# Patient Record
Sex: Female | Born: 1980 | Race: Black or African American | Hispanic: No | Marital: Married | Smoking: Never smoker
Health system: Southern US, Community
[De-identification: ages and names within clinical notes are randomized; demographics above are authoritative.]

## PROBLEM LIST (undated history)

## (undated) DIAGNOSIS — G8929 Other chronic pain: Secondary | ICD-10-CM

## (undated) DIAGNOSIS — Z809 Family history of malignant neoplasm, unspecified: Secondary | ICD-10-CM

## (undated) DIAGNOSIS — C50919 Malignant neoplasm of unspecified site of unspecified female breast: Secondary | ICD-10-CM

## (undated) DIAGNOSIS — R51 Headache: Secondary | ICD-10-CM

## (undated) DIAGNOSIS — J189 Pneumonia, unspecified organism: Secondary | ICD-10-CM

## (undated) DIAGNOSIS — R519 Headache, unspecified: Secondary | ICD-10-CM

## (undated) DIAGNOSIS — B009 Herpesviral infection, unspecified: Secondary | ICD-10-CM

## (undated) HISTORY — DX: Other chronic pain: G89.29

## (undated) HISTORY — DX: Family history of malignant neoplasm, unspecified: Z80.9

## (undated) HISTORY — PX: TUBAL LIGATION: SHX77

## (undated) HISTORY — PX: DILATION AND CURETTAGE OF UTERUS: SHX78

## (undated) HISTORY — DX: Headache: R51

## (undated) HISTORY — DX: Headache, unspecified: R51.9

---

## 2000-02-05 ENCOUNTER — Emergency Department (HOSPITAL_COMMUNITY): Admission: EM | Admit: 2000-02-05 | Discharge: 2000-02-05 | Payer: Self-pay | Admitting: Emergency Medicine

## 2001-10-10 ENCOUNTER — Inpatient Hospital Stay (HOSPITAL_COMMUNITY): Admission: AD | Admit: 2001-10-10 | Discharge: 2001-10-10 | Payer: Self-pay | Admitting: Obstetrics

## 2001-11-12 ENCOUNTER — Inpatient Hospital Stay (HOSPITAL_COMMUNITY): Admission: AD | Admit: 2001-11-12 | Discharge: 2001-11-12 | Payer: Self-pay | Admitting: Obstetrics

## 2001-11-23 ENCOUNTER — Encounter (HOSPITAL_COMMUNITY): Admission: RE | Admit: 2001-11-23 | Discharge: 2001-12-21 | Payer: Self-pay | Admitting: Obstetrics

## 2001-12-18 ENCOUNTER — Inpatient Hospital Stay (HOSPITAL_COMMUNITY): Admission: AD | Admit: 2001-12-18 | Discharge: 2001-12-18 | Payer: Self-pay | Admitting: Obstetrics

## 2001-12-19 ENCOUNTER — Inpatient Hospital Stay (HOSPITAL_COMMUNITY): Admission: AD | Admit: 2001-12-19 | Discharge: 2001-12-19 | Payer: Self-pay | Admitting: Obstetrics

## 2001-12-20 ENCOUNTER — Inpatient Hospital Stay (HOSPITAL_COMMUNITY): Admission: AD | Admit: 2001-12-20 | Discharge: 2001-12-21 | Payer: Self-pay | Admitting: Obstetrics

## 2002-06-24 ENCOUNTER — Encounter: Payer: Self-pay | Admitting: Emergency Medicine

## 2002-06-24 ENCOUNTER — Emergency Department (HOSPITAL_COMMUNITY): Admission: EM | Admit: 2002-06-24 | Discharge: 2002-06-24 | Payer: Self-pay | Admitting: Emergency Medicine

## 2002-08-27 ENCOUNTER — Encounter (INDEPENDENT_AMBULATORY_CARE_PROVIDER_SITE_OTHER): Payer: Self-pay | Admitting: *Deleted

## 2002-08-27 ENCOUNTER — Ambulatory Visit (HOSPITAL_COMMUNITY): Admission: RE | Admit: 2002-08-27 | Discharge: 2002-08-27 | Payer: Self-pay | Admitting: Obstetrics and Gynecology

## 2002-10-03 ENCOUNTER — Emergency Department (HOSPITAL_COMMUNITY): Admission: EM | Admit: 2002-10-03 | Discharge: 2002-10-03 | Payer: Self-pay | Admitting: Emergency Medicine

## 2003-01-08 ENCOUNTER — Emergency Department (HOSPITAL_COMMUNITY): Admission: EM | Admit: 2003-01-08 | Discharge: 2003-01-09 | Payer: Self-pay | Admitting: Emergency Medicine

## 2003-05-14 ENCOUNTER — Emergency Department (HOSPITAL_COMMUNITY): Admission: EM | Admit: 2003-05-14 | Discharge: 2003-05-14 | Payer: Self-pay | Admitting: Emergency Medicine

## 2003-06-10 ENCOUNTER — Inpatient Hospital Stay (HOSPITAL_COMMUNITY): Admission: AD | Admit: 2003-06-10 | Discharge: 2003-06-10 | Payer: Self-pay | Admitting: Obstetrics & Gynecology

## 2003-06-26 ENCOUNTER — Inpatient Hospital Stay (HOSPITAL_COMMUNITY): Admission: AD | Admit: 2003-06-26 | Discharge: 2003-06-26 | Payer: Self-pay | Admitting: Family Medicine

## 2003-07-06 ENCOUNTER — Inpatient Hospital Stay (HOSPITAL_COMMUNITY): Admission: AD | Admit: 2003-07-06 | Discharge: 2003-07-06 | Payer: Self-pay | Admitting: Obstetrics & Gynecology

## 2003-07-24 ENCOUNTER — Encounter: Payer: Self-pay | Admitting: Obstetrics

## 2003-07-24 ENCOUNTER — Inpatient Hospital Stay (HOSPITAL_COMMUNITY): Admission: AD | Admit: 2003-07-24 | Discharge: 2003-07-24 | Payer: Self-pay | Admitting: Obstetrics

## 2003-09-02 ENCOUNTER — Encounter: Payer: Self-pay | Admitting: Obstetrics & Gynecology

## 2003-09-02 ENCOUNTER — Ambulatory Visit (HOSPITAL_COMMUNITY): Admission: RE | Admit: 2003-09-02 | Discharge: 2003-09-02 | Payer: Self-pay | Admitting: Obstetrics & Gynecology

## 2003-10-16 ENCOUNTER — Inpatient Hospital Stay (HOSPITAL_COMMUNITY): Admission: AD | Admit: 2003-10-16 | Discharge: 2003-10-16 | Payer: Self-pay | Admitting: Obstetrics & Gynecology

## 2004-01-04 ENCOUNTER — Observation Stay (HOSPITAL_COMMUNITY): Admission: AD | Admit: 2004-01-04 | Discharge: 2004-01-05 | Payer: Self-pay | Admitting: Obstetrics & Gynecology

## 2004-01-18 ENCOUNTER — Inpatient Hospital Stay (HOSPITAL_COMMUNITY): Admission: AD | Admit: 2004-01-18 | Discharge: 2004-01-18 | Payer: Self-pay | Admitting: Obstetrics & Gynecology

## 2004-01-31 ENCOUNTER — Inpatient Hospital Stay (HOSPITAL_COMMUNITY): Admission: AD | Admit: 2004-01-31 | Discharge: 2004-02-02 | Payer: Self-pay | Admitting: Obstetrics & Gynecology

## 2005-01-23 ENCOUNTER — Emergency Department (HOSPITAL_COMMUNITY): Admission: EM | Admit: 2005-01-23 | Discharge: 2005-01-23 | Payer: Self-pay | Admitting: Family Medicine

## 2005-05-21 ENCOUNTER — Emergency Department (HOSPITAL_COMMUNITY): Admission: EM | Admit: 2005-05-21 | Discharge: 2005-05-21 | Payer: Self-pay | Admitting: Emergency Medicine

## 2005-05-24 ENCOUNTER — Inpatient Hospital Stay (HOSPITAL_COMMUNITY): Admission: AD | Admit: 2005-05-24 | Discharge: 2005-05-24 | Payer: Self-pay | Admitting: *Deleted

## 2005-06-18 ENCOUNTER — Encounter (INDEPENDENT_AMBULATORY_CARE_PROVIDER_SITE_OTHER): Payer: Self-pay | Admitting: *Deleted

## 2005-06-18 LAB — CONVERTED CEMR LAB

## 2005-08-06 ENCOUNTER — Ambulatory Visit: Payer: Self-pay | Admitting: Sports Medicine

## 2005-08-20 ENCOUNTER — Ambulatory Visit: Payer: Self-pay | Admitting: Family Medicine

## 2005-08-27 ENCOUNTER — Ambulatory Visit: Payer: Self-pay | Admitting: Sports Medicine

## 2005-09-05 ENCOUNTER — Emergency Department (HOSPITAL_COMMUNITY): Admission: EM | Admit: 2005-09-05 | Discharge: 2005-09-05 | Payer: Self-pay | Admitting: Emergency Medicine

## 2006-02-25 ENCOUNTER — Other Ambulatory Visit: Admission: RE | Admit: 2006-02-25 | Discharge: 2006-02-25 | Payer: Self-pay | Admitting: Obstetrics and Gynecology

## 2006-05-26 ENCOUNTER — Ambulatory Visit (HOSPITAL_COMMUNITY): Admission: RE | Admit: 2006-05-26 | Discharge: 2006-05-26 | Payer: Self-pay | Admitting: Obstetrics and Gynecology

## 2006-08-23 ENCOUNTER — Inpatient Hospital Stay (HOSPITAL_COMMUNITY): Admission: AD | Admit: 2006-08-23 | Discharge: 2006-08-23 | Payer: Self-pay | Admitting: Obstetrics and Gynecology

## 2006-09-03 ENCOUNTER — Inpatient Hospital Stay (HOSPITAL_COMMUNITY): Admission: AD | Admit: 2006-09-03 | Discharge: 2006-09-03 | Payer: Self-pay | Admitting: Obstetrics and Gynecology

## 2006-09-10 ENCOUNTER — Ambulatory Visit: Payer: Self-pay | Admitting: Obstetrics & Gynecology

## 2006-09-17 ENCOUNTER — Ambulatory Visit: Payer: Self-pay | Admitting: Obstetrics & Gynecology

## 2006-09-22 ENCOUNTER — Ambulatory Visit: Payer: Self-pay | Admitting: *Deleted

## 2006-09-22 ENCOUNTER — Inpatient Hospital Stay (HOSPITAL_COMMUNITY): Admission: AD | Admit: 2006-09-22 | Discharge: 2006-09-23 | Payer: Self-pay | Admitting: Obstetrics & Gynecology

## 2006-09-23 ENCOUNTER — Ambulatory Visit (HOSPITAL_COMMUNITY): Admission: RE | Admit: 2006-09-23 | Discharge: 2006-09-23 | Payer: Self-pay | Admitting: Obstetrics and Gynecology

## 2006-09-24 ENCOUNTER — Ambulatory Visit: Payer: Self-pay | Admitting: Obstetrics & Gynecology

## 2006-09-29 ENCOUNTER — Inpatient Hospital Stay (HOSPITAL_COMMUNITY): Admission: AD | Admit: 2006-09-29 | Discharge: 2006-09-29 | Payer: Self-pay | Admitting: Obstetrics & Gynecology

## 2006-09-29 ENCOUNTER — Ambulatory Visit: Payer: Self-pay | Admitting: Obstetrics and Gynecology

## 2006-10-01 ENCOUNTER — Ambulatory Visit: Payer: Self-pay | Admitting: Obstetrics & Gynecology

## 2006-10-08 ENCOUNTER — Ambulatory Visit: Payer: Self-pay | Admitting: Family Medicine

## 2006-10-15 ENCOUNTER — Ambulatory Visit: Payer: Self-pay | Admitting: Obstetrics & Gynecology

## 2006-10-20 ENCOUNTER — Inpatient Hospital Stay (HOSPITAL_COMMUNITY): Admission: RE | Admit: 2006-10-20 | Discharge: 2006-10-22 | Payer: Self-pay | Admitting: Obstetrics & Gynecology

## 2006-10-20 ENCOUNTER — Ambulatory Visit: Payer: Self-pay | Admitting: Gynecology

## 2006-11-05 ENCOUNTER — Ambulatory Visit: Payer: Self-pay | Admitting: Obstetrics and Gynecology

## 2006-12-11 ENCOUNTER — Emergency Department (HOSPITAL_COMMUNITY): Admission: EM | Admit: 2006-12-11 | Discharge: 2006-12-11 | Payer: Self-pay | Admitting: Family Medicine

## 2007-01-15 DIAGNOSIS — N879 Dysplasia of cervix uteri, unspecified: Secondary | ICD-10-CM | POA: Insufficient documentation

## 2007-01-15 DIAGNOSIS — K5900 Constipation, unspecified: Secondary | ICD-10-CM | POA: Insufficient documentation

## 2007-01-16 ENCOUNTER — Encounter (INDEPENDENT_AMBULATORY_CARE_PROVIDER_SITE_OTHER): Payer: Self-pay | Admitting: *Deleted

## 2007-02-11 ENCOUNTER — Emergency Department (HOSPITAL_COMMUNITY): Admission: EM | Admit: 2007-02-11 | Discharge: 2007-02-11 | Payer: Self-pay | Admitting: Emergency Medicine

## 2007-10-27 ENCOUNTER — Emergency Department (HOSPITAL_COMMUNITY): Admission: EM | Admit: 2007-10-27 | Discharge: 2007-10-27 | Payer: Self-pay | Admitting: Emergency Medicine

## 2008-01-13 ENCOUNTER — Emergency Department (HOSPITAL_COMMUNITY): Admission: EM | Admit: 2008-01-13 | Discharge: 2008-01-14 | Payer: Self-pay | Admitting: Emergency Medicine

## 2008-09-01 ENCOUNTER — Emergency Department (HOSPITAL_COMMUNITY): Admission: EM | Admit: 2008-09-01 | Discharge: 2008-09-02 | Payer: Self-pay | Admitting: Emergency Medicine

## 2008-11-18 DIAGNOSIS — J189 Pneumonia, unspecified organism: Secondary | ICD-10-CM

## 2008-11-18 HISTORY — DX: Pneumonia, unspecified organism: J18.9

## 2009-01-03 ENCOUNTER — Ambulatory Visit (HOSPITAL_COMMUNITY): Admission: RE | Admit: 2009-01-03 | Discharge: 2009-01-03 | Payer: Self-pay | Admitting: Obstetrics

## 2009-01-24 ENCOUNTER — Emergency Department (HOSPITAL_BASED_OUTPATIENT_CLINIC_OR_DEPARTMENT_OTHER): Admission: EM | Admit: 2009-01-24 | Discharge: 2009-01-24 | Payer: Self-pay | Admitting: Emergency Medicine

## 2010-08-11 ENCOUNTER — Emergency Department (HOSPITAL_COMMUNITY): Admission: EM | Admit: 2010-08-11 | Discharge: 2010-08-11 | Payer: Self-pay | Admitting: Emergency Medicine

## 2010-08-20 ENCOUNTER — Emergency Department (HOSPITAL_COMMUNITY): Admission: EM | Admit: 2010-08-20 | Discharge: 2010-08-20 | Payer: Self-pay | Admitting: Emergency Medicine

## 2010-09-01 ENCOUNTER — Emergency Department (HOSPITAL_COMMUNITY): Admission: EM | Admit: 2010-09-01 | Discharge: 2010-09-01 | Payer: Self-pay | Admitting: Emergency Medicine

## 2010-12-05 ENCOUNTER — Emergency Department (HOSPITAL_COMMUNITY)
Admission: EM | Admit: 2010-12-05 | Discharge: 2010-12-05 | Payer: Self-pay | Source: Home / Self Care | Admitting: Emergency Medicine

## 2010-12-10 LAB — URINALYSIS, ROUTINE W REFLEX MICROSCOPIC
Nitrite: NEGATIVE
Protein, ur: NEGATIVE mg/dL
Specific Gravity, Urine: 1.012 (ref 1.005–1.030)
Urine Glucose, Fasting: NEGATIVE mg/dL
pH: 8 (ref 5.0–8.0)

## 2010-12-10 LAB — POCT I-STAT, CHEM 8
BUN: 8 mg/dL (ref 6–23)
Calcium, Ion: 1.13 mmol/L (ref 1.12–1.32)
Chloride: 109 mEq/L (ref 96–112)
HCT: 35 % — ABNORMAL LOW (ref 36.0–46.0)
Hemoglobin: 11.9 g/dL — ABNORMAL LOW (ref 12.0–15.0)
Potassium: 4 mEq/L (ref 3.5–5.1)

## 2010-12-10 LAB — URINE MICROSCOPIC-ADD ON

## 2010-12-10 LAB — RAPID URINE DRUG SCREEN, HOSP PERFORMED
Amphetamines: NOT DETECTED
Barbiturates: NOT DETECTED
Tetrahydrocannabinol: NOT DETECTED

## 2011-01-31 LAB — POCT I-STAT, CHEM 8
Chloride: 105 mEq/L (ref 96–112)
Creatinine, Ser: 0.9 mg/dL (ref 0.4–1.2)
Glucose, Bld: 106 mg/dL — ABNORMAL HIGH (ref 70–99)
HCT: 35 % — ABNORMAL LOW (ref 36.0–46.0)
Hemoglobin: 11.9 g/dL — ABNORMAL LOW (ref 12.0–15.0)
Potassium: 3.5 mEq/L (ref 3.5–5.1)
TCO2: 25 mmol/L (ref 0–100)

## 2011-01-31 LAB — URINALYSIS, ROUTINE W REFLEX MICROSCOPIC
Glucose, UA: NEGATIVE mg/dL
Ketones, ur: NEGATIVE mg/dL
Nitrite: NEGATIVE
Specific Gravity, Urine: 1.015 (ref 1.005–1.030)
Urobilinogen, UA: 1 mg/dL (ref 0.0–1.0)
pH: 5.5 (ref 5.0–8.0)
pH: 6.5 (ref 5.0–8.0)

## 2011-01-31 LAB — ETHANOL: Alcohol, Ethyl (B): <5 mg/dL (ref 0–10)

## 2011-02-28 LAB — PREGNANCY, URINE: Preg Test, Ur: NEGATIVE

## 2011-02-28 LAB — URINE CULTURE: Colony Count: >=100000

## 2011-02-28 LAB — URINALYSIS, ROUTINE W REFLEX MICROSCOPIC
Bilirubin Urine: NEGATIVE
Specific Gravity, Urine: 1.012 (ref 1.005–1.030)

## 2011-02-28 LAB — URINE MICROSCOPIC-ADD ON

## 2011-03-05 LAB — CBC
HCT: 35.3 % — ABNORMAL LOW (ref 36.0–46.0)
MCV: 87.4 fL (ref 78.0–100.0)
Platelets: 258 10*3/uL (ref 150–400)
RBC: 4.03 MIL/uL (ref 3.87–5.11)
WBC: 7.5 10*3/uL (ref 4.0–10.5)

## 2011-03-05 LAB — ABO/RH: ABO/RH(D): A POS

## 2011-04-02 NOTE — Op Note (Signed)
Patricia Jenkins, Patricia Jenkins                  ACCOUNT NO.:  [id number]   MEDICAL RECORD NO.:  [id number]          PATIENT TYPE:  AMB   LOCATION:  SDC                           [facility]ACILITY:  [facility]   PHYSICIAN:  Lendon Colonel, MD   DATE OF BIRTH:  December 23, 1980   DATE OF PROCEDURE:  01/03/2009  DATE OF DISCHARGE:                               OPERATIVE REPORT   PREOPERATIVE DIAGNOSIS:  Undesired fertility.   POSTOPERATIVE DIAGNOSIS:  Undesired fertility.   PROCEDURE:  Laparoscopic tubal ligation.   SURGEON:  Alphonsus Sias. Ernestina Penna, MD   ASSISTANTS:  None.   ANESTHESIA:  General.   FINDINGS:  Normal uterus, tubes, and ovaries.  Normal liver edge.  Spot  of endometriosis at the left tubo-uterine junction.  No other signs of  endometriosis.  No ovarian cysts.   SPECIMENS:  None.   DISPOSITION:  None.   ANTIBIOTICS:  None.   ESTIMATED BLOOD LOSS:  Minimal.   COMPLICATIONS:  Equipment failure.   The patient in PACU in good condition.   PROCEDURE:  After informed consent was signed and obtained from the  patient, the patient was taken to the operating room where general  anesthesia was administered without complication.  The patient was  prepped and draped in normal sterile fashion in dorsal supine lithotomy  position.  A straight cath was performed.  A bimanual examination was  done to assess the size and position of the uterus.  A sterile speculum  was inserted into the vagina.  A single-tooth tenaculum was used to  grasp at the 12 o'clock position.  Cervix was sounded to 8.5 cm.  A ZUMI  uterine manipulator was placed into the uterus without complication.  Intrauterine balloon was inflated.  Instruments were removed from the  vagina.  Attention was turned to the patient's abdomen.  5 mL of 0.5%  Marcaine was infused in the infraumbilical folds.  A 5-mm skin incision  was made with the scalpel.  Veress needle was inserted.  Intraperitoneal  placement was confirmed.  Pneumoperitoneum was  created and a 5-mm non-  bladed XL trocar was inserted into the peritoneal cavity.  Pneumoperitoneum was maintained.  Laparoscope was inserted to the  patient's abdomen with findings as above.  An 8-mm skin incision was  made 2 fingerbreadths above the pubic symphysis in the midline with the  scalpel after first infusing with 5 mL of 0.5% Marcaine.  The 8-mm  bladed trocar was inserted into the peritoneal cavity with  visualization.  The Falope ring applicator was placed through this  trocar and initial attempts at extending the Falope ring times two were  unsuccessful.  The instruments were removed and the Falope ring  applicator was inspected. It was found that the tines were not aligned  properly. A second applicator was openend and found to have proper  alignment. On attempt at pulling the tube into the Falope ring  applicator, there was inability to apply the Falope ring.  This resulted  in transection of the tube and a small amount of bleeding.  The faulty  piece of  equipment was assessed by the several the OR nurses as well as  myself and the ring applicator was reloaded.  At this time, it was  returned back into the abdomen.  A 2-cm knuckle tube was pulled up into  the Falope ring and with easy application of the Falope ring good  blanching of the tube was noted.  The Falope ring applicator was  removed, reinserted, and 3 attempts at applying the Falope ring on the  contralateral tube were unsuccessful with each time resulting in  transection of a portion of the tube and mesosalpinx.  A very small  amount of bleeding was noted.  At this point, the decision was made to  proceed with a Filshie clip.  The Filshie clip applier was loaded,  placed through the 8-mm suprapubic port and Filshie clip was placed on  both the proximal and distal segments of the transected tube.  Good  hemostasis was noted.  All pneumoperitoneum was removed from the  abdomen.  Several minutes were allowed to  pass.  Pneumoperitoneum was  reinstituted and visualization of both transected tubes were done.  No  bleeding was noted after several minutes with no pneumoperitoneum.  It  was felt the patient would be hemodynamically sound.  Procedure was then  terminated.  All instruments were removed from the pelvis.  Pneumoperitoneum was released.  The skin was closed with sutures and  Dermabond.  Repeat speculum exams after removal of the vaginal  instruments was done.  Hemostasis was of the cervix and the uterus.  The  patient was awoken of general anesthesia having tolerated the procedure  well.  Sponge, lap, and needle counts were correct x3.      Lendon Colonel, MD  Electronically Signed     KAF/MEDQ  D:  01/03/2009  T:  01/04/2009  Job:  [id number]

## 2011-04-05 NOTE — Op Note (Signed)
   NAME:  Patricia Jenkins, Patricia Jenkins                            ACCOUNT NO.:  [id number]   MEDICAL RECORD NO.:  [id number]                   PATIENT TYPE:  AMB   LOCATION:  SDC                                  [facility]   PHYSICIAN:  Lenoard Aden, M.D.             DATE OF BIRTH:  1981/07/28   DATE OF PROCEDURE:  DATE OF DISCHARGE:                                 OPERATIVE REPORT   PREOPERATIVE DIAGNOSES:  Missed AB at 10 weeks.   POSTOPERATIVE DIAGNOSES:  Missed AB at 10 weeks.   PROCEDURE:  Suction D&E.   SURGEON:  Lenoard Aden, M.D.   ANESTHESIA:  MAC and paracervical.   ESTIMATED BLOOD LOSS:  Less than 50 cc.   COMPLICATIONS:  None.   DRAINS:  None.   COUNTS:  Correct.   DISPOSITION:  Patient apparently in good condition.   PATHOLOGY:  Products of conception to pathology.   PROCEDURE IN DETAIL:  Being apprized of the risks of anesthesia, infection,  bleeding, uterine perforation, and possible need for repair, the patient was  brought to the operating room.  She was administered with IV sedation  without difficulty and prepped and draped in the usual sterile fashion.  She  was catheterized.  It was felt that bladder was empty.  Exam under  anesthesia revealed an 8-10 week anteflexed uterus and no adnexal masses.  After achieving adequate anesthesia, a dilute solution was used to place the  paracervical block using 20 cc of Xylocaine solution in a standard fashion.  The uterus was sounded to 10 cm and easily dilated up to a #31 Pratt  dilator.  A 10 mm curved suction curette was placed.  Suction reveals  products of conception which were aspirated without difficulty.  Blunt  curettage in a four quadrant method confirms emptiness in the cavity.  Repeat suction curettage confirms.  The estimated blood loss was less than  50 cc.  Instruments were removed from the vagina.  The patient was awakened  and transferred to recovery in good condition.                          Lenoard Aden, M.D.    RJT/MEDQ  D:  08/27/2002  T:  08/28/2002  Job:  [id number]

## 2011-04-05 NOTE — Group Therapy Note (Signed)
Patricia Jenkins, Patricia Jenkins NO.:  [id number]   MEDICAL RECORD NO.:  [id number]          PATIENT TYPE:  WOC   LOCATION:  [facility]                   FACILITY:  [facility]   PHYSICIAN:  Argentina Donovan, MD        DATE OF BIRTH:  [birth year]   DATE OF SERVICE:  11/05/2006                                  CLINIC NOTE   The patient is a 30 year old African American female gravida 4, para 3-0-  1-3, her oldest child being 53 years old, her youngest 2 weeks.  The  patient comes in crying uncontrollably, severe coping disorder  postpartum, lives alone without much support. Her mother works in  hospice but has her own problems. Received a letter today that she is  being let go from her job, Christmas is around the corner, and she has  three small children, newborn, the oldest being four years old.  We are  giving her some Xanax to help control her immediate anxiety. Was started  on Prozac. Told her it may take 2 weeks before she gets any result and  giving her a phone number to call behavioral health center, hoping she  will get some more psychological support and help.   IMPRESSION:  Postpartum coping disorder.           ______________________________  Argentina Donovan, MD     PR/MEDQ  D:  11/05/2006  T:  11/05/2006  Job:  [id number]

## 2011-04-19 ENCOUNTER — Emergency Department (HOSPITAL_COMMUNITY)
Admission: EM | Admit: 2011-04-19 | Discharge: 2011-04-19 | Disposition: A | Discharge status: Home / Self Care | Payer: Self-pay | Attending: Emergency Medicine | Admitting: Emergency Medicine

## 2011-04-19 DIAGNOSIS — N898 Other specified noninflammatory disorders of vagina: Secondary | ICD-10-CM | POA: Insufficient documentation

## 2011-04-19 DIAGNOSIS — A6000 Herpesviral infection of urogenital system, unspecified: Secondary | ICD-10-CM | POA: Insufficient documentation

## 2011-04-19 LAB — URINE MICROSCOPIC-ADD ON

## 2011-04-19 LAB — URINALYSIS, ROUTINE W REFLEX MICROSCOPIC
Nitrite: NEGATIVE
Specific Gravity, Urine: 1.013 (ref 1.005–1.030)
Urobilinogen, UA: 0.2 mg/dL (ref 0.0–1.0)

## 2011-04-19 LAB — POCT PREGNANCY, URINE: Preg Test, Ur: NEGATIVE

## 2011-04-20 LAB — GC/CHLAMYDIA PROBE AMP, GENITAL
Chlamydia, DNA Probe: NEGATIVE
GC Probe Amp, Genital: NEGATIVE

## 2011-04-21 LAB — URINE CULTURE: Culture  Setup Time: 201206020043

## 2011-07-02 ENCOUNTER — Emergency Department (HOSPITAL_COMMUNITY)
Admission: EM | Admit: 2011-07-02 | Discharge: 2011-07-02 | Disposition: A | Discharge status: Home / Self Care | Payer: Self-pay | Attending: Emergency Medicine | Admitting: Emergency Medicine

## 2011-07-02 DIAGNOSIS — F3289 Other specified depressive episodes: Secondary | ICD-10-CM | POA: Insufficient documentation

## 2011-07-02 DIAGNOSIS — F329 Major depressive disorder, single episode, unspecified: Secondary | ICD-10-CM | POA: Insufficient documentation

## 2011-07-02 DIAGNOSIS — F341 Dysthymic disorder: Secondary | ICD-10-CM | POA: Insufficient documentation

## 2011-07-02 LAB — URINALYSIS, ROUTINE W REFLEX MICROSCOPIC
Bilirubin Urine: NEGATIVE
Hgb urine dipstick: NEGATIVE
Nitrite: NEGATIVE
Specific Gravity, Urine: 1.01 (ref 1.005–1.030)
pH: 7.5 (ref 5.0–8.0)

## 2011-07-02 LAB — URINE MICROSCOPIC-ADD ON

## 2011-07-02 LAB — PREGNANCY, URINE: Preg Test, Ur: NEGATIVE

## 2011-08-09 LAB — URINALYSIS, ROUTINE W REFLEX MICROSCOPIC
Bilirubin Urine: NEGATIVE
Nitrite: NEGATIVE
Protein, ur: 30 — AB
Specific Gravity, Urine: 1.031 — ABNORMAL HIGH
Urobilinogen, UA: 1

## 2011-08-09 LAB — DIFFERENTIAL
Basophils Absolute: 0
Basophils Relative: 0
Eosinophils Relative: 0
Monocytes Absolute: 0.9
Monocytes Relative: 8

## 2011-08-09 LAB — PREGNANCY, URINE: Preg Test, Ur: NEGATIVE

## 2011-08-09 LAB — BASIC METABOLIC PANEL
CO2: 28
Calcium: 9
Chloride: 105
Glucose, Bld: 119 — ABNORMAL HIGH
Potassium: 3.6
Sodium: 139

## 2011-08-09 LAB — URINE MICROSCOPIC-ADD ON

## 2011-08-09 LAB — CBC
HCT: 34.5 — ABNORMAL LOW
Hemoglobin: 11.9 — ABNORMAL LOW
MCHC: 34.5
MCV: 81.7
RBC: 4.23
RDW: 15.4

## 2011-08-10 ENCOUNTER — Inpatient Hospital Stay (INDEPENDENT_AMBULATORY_CARE_PROVIDER_SITE_OTHER)
Admission: RE | Admit: 2011-08-10 | Discharge: 2011-08-10 | Disposition: A | Discharge status: Home / Self Care | Payer: Self-pay | Source: Ambulatory Visit | Attending: Emergency Medicine | Admitting: Emergency Medicine

## 2011-08-10 DIAGNOSIS — N943 Premenstrual tension syndrome: Secondary | ICD-10-CM

## 2011-08-20 LAB — POCT PREGNANCY, URINE: Preg Test, Ur: NEGATIVE

## 2011-08-20 LAB — URINALYSIS, ROUTINE W REFLEX MICROSCOPIC
Bilirubin Urine: NEGATIVE
Glucose, UA: NEGATIVE
Hgb urine dipstick: NEGATIVE
Ketones, ur: NEGATIVE
Nitrite: NEGATIVE
Protein, ur: NEGATIVE
Specific Gravity, Urine: 1.02
Urobilinogen, UA: 0.2
pH: 6

## 2011-08-26 LAB — PREGNANCY, URINE: Preg Test, Ur: NEGATIVE

## 2011-08-26 LAB — URINE MICROSCOPIC-ADD ON

## 2011-08-26 LAB — URINALYSIS, ROUTINE W REFLEX MICROSCOPIC
Bilirubin Urine: NEGATIVE
Nitrite: POSITIVE — AB
Protein, ur: 30 — AB
Specific Gravity, Urine: 1.021
Urobilinogen, UA: 1

## 2011-10-02 ENCOUNTER — Inpatient Hospital Stay (HOSPITAL_COMMUNITY)
Admission: EM | Admit: 2011-10-02 | Discharge: 2011-10-04 | DRG: 419 | Disposition: A | Discharge status: Home / Self Care | Payer: Medicaid Other | Attending: Surgery | Admitting: Surgery

## 2011-10-02 ENCOUNTER — Other Ambulatory Visit: Payer: Self-pay

## 2011-10-02 ENCOUNTER — Emergency Department (HOSPITAL_COMMUNITY): Payer: Medicaid Other

## 2011-10-02 ENCOUNTER — Encounter: Payer: Self-pay | Admitting: *Deleted

## 2011-10-02 DIAGNOSIS — K8010 Calculus of gallbladder with chronic cholecystitis without obstruction: Secondary | ICD-10-CM

## 2011-10-02 DIAGNOSIS — B009 Herpesviral infection, unspecified: Secondary | ICD-10-CM | POA: Diagnosis present

## 2011-10-02 DIAGNOSIS — K8020 Calculus of gallbladder without cholecystitis without obstruction: Secondary | ICD-10-CM | POA: Diagnosis present

## 2011-10-02 DIAGNOSIS — R748 Abnormal levels of other serum enzymes: Secondary | ICD-10-CM

## 2011-10-02 DIAGNOSIS — K8050 Calculus of bile duct without cholangitis or cholecystitis without obstruction: Secondary | ICD-10-CM | POA: Diagnosis present

## 2011-10-02 HISTORY — DX: Pneumonia, unspecified organism: J18.9

## 2011-10-02 HISTORY — DX: Herpesviral infection, unspecified: B00.9

## 2011-10-02 LAB — CBC
HCT: 34.8 % — ABNORMAL LOW (ref 36.0–46.0)
Hemoglobin: 11.8 g/dL — ABNORMAL LOW (ref 12.0–15.0)
MCH: 26.9 pg (ref 26.0–34.0)
MCHC: 33.9 g/dL (ref 30.0–36.0)

## 2011-10-02 LAB — DIFFERENTIAL
Basophils Relative: 0 % (ref 0–1)
Eosinophils Absolute: 0 10*3/uL (ref 0.0–0.7)
Eosinophils Relative: 0 % (ref 0–5)
Monocytes Absolute: 0.8 10*3/uL (ref 0.1–1.0)
Monocytes Relative: 6 % (ref 3–12)
Neutrophils Relative %: 89 % — ABNORMAL HIGH (ref 43–77)

## 2011-10-02 LAB — COMPREHENSIVE METABOLIC PANEL
ALT: 168 U/L — ABNORMAL HIGH (ref 0–35)
AST: 401 U/L — ABNORMAL HIGH (ref 0–37)
CO2: 24 mEq/L (ref 19–32)
Calcium: 10.1 mg/dL (ref 8.4–10.5)
GFR calc non Af Amer: >90 mL/min (ref >90)
Potassium: 4.5 mEq/L (ref 3.5–5.1)

## 2011-10-02 MED ORDER — PANTOPRAZOLE SODIUM 40 MG IV SOLR
40.0000 mg | Freq: Every day | INTRAVENOUS | Status: DC
  Administered 2011-10-02 – 2011-10-03 (×2): 40 mg via INTRAVENOUS
  Filled 2011-10-02 (×2): qty 40

## 2011-10-02 MED ORDER — MORPHINE SULFATE 2 MG/ML IJ SOLN
INTRAMUSCULAR | Status: AC
  Filled 2011-10-02: qty 1

## 2011-10-02 MED ORDER — FENTANYL CITRATE 0.05 MG/ML IJ SOLN
INTRAMUSCULAR | Status: AC
  Filled 2011-10-02: qty 2

## 2011-10-02 MED ORDER — HYDROMORPHONE HCL PF 2 MG/ML IJ SOLN
INTRAMUSCULAR | Status: AC
  Filled 2011-10-02: qty 1

## 2011-10-02 MED ORDER — HYDROMORPHONE HCL PF 1 MG/ML IJ SOLN: 1.0000 mg | Freq: Once | INTRAMUSCULAR | Status: DC

## 2011-10-02 MED ORDER — ONDANSETRON HCL 4 MG/2ML IJ SOLN
INTRAMUSCULAR | Status: AC
  Filled 2011-10-02: qty 2

## 2011-10-02 MED ORDER — MORPHINE SULFATE 4 MG/ML IJ SOLN
4.0000 mg | Freq: Once | INTRAMUSCULAR | Status: AC
  Administered 2011-10-02: 4 mg via INTRAVENOUS
  Filled 2011-10-02: qty 1

## 2011-10-02 MED ORDER — KCL IN DEXTROSE-NACL 20-5-0.45 MEQ/L-%-% IV SOLN
INTRAVENOUS | Status: DC
  Administered 2011-10-02 – 2011-10-03 (×3): via INTRAVENOUS
  Filled 2011-10-02 (×5): qty 1000

## 2011-10-02 MED ORDER — MORPHINE SULFATE 2 MG/ML IJ SOLN
1.0000 mg | INTRAMUSCULAR | Status: DC | PRN
  Administered 2011-10-02 – 2011-10-04 (×8): 2 mg via INTRAVENOUS
  Filled 2011-10-02 (×7): qty 1

## 2011-10-02 MED ORDER — ONDANSETRON HCL 4 MG/2ML IJ SOLN
4.0000 mg | Freq: Once | INTRAMUSCULAR | Status: AC
  Administered 2011-10-02: 4 mg via INTRAVENOUS

## 2011-10-02 MED ORDER — ONDANSETRON HCL 4 MG/2ML IJ SOLN
INTRAMUSCULAR | Status: AC
  Administered 2011-10-02: 4 mg
  Filled 2011-10-02: qty 2

## 2011-10-02 MED ORDER — ACETAMINOPHEN 325 MG PO TABS: 650.0000 mg | ORAL_TABLET | Freq: Four times a day (QID) | ORAL | Status: DC | PRN

## 2011-10-02 MED ORDER — ACETAMINOPHEN 650 MG RE SUPP: 650.0000 mg | Freq: Four times a day (QID) | RECTAL | Status: DC | PRN

## 2011-10-02 MED ORDER — PIPERACILLIN-TAZOBACTAM 3.375 G IVPB
3.3750 g | Freq: Three times a day (TID) | INTRAVENOUS | Status: DC
  Administered 2011-10-02 – 2011-10-03 (×4): 3.375 g via INTRAVENOUS
  Filled 2011-10-02 (×8): qty 50

## 2011-10-02 MED ORDER — HYDROMORPHONE HCL PF 1 MG/ML IJ SOLN
INTRAMUSCULAR | Status: AC
  Filled 2011-10-02: qty 1

## 2011-10-02 MED ORDER — INFLUENZA VIRUS VACC SPLIT PF IM SUSP
0.5000 mL | INTRAMUSCULAR | Status: AC
  Administered 2011-10-03: 0.5 mL via INTRAMUSCULAR
  Filled 2011-10-02: qty 0.5

## 2011-10-02 MED ORDER — SODIUM CHLORIDE 0.9 % IV BOLUS (SEPSIS)
500.0000 mL | Freq: Once | INTRAVENOUS | Status: AC
  Administered 2011-10-02: 500 mL via INTRAVENOUS

## 2011-10-02 MED ORDER — SODIUM CHLORIDE 0.9 % IV SOLN
999.0000 mL | INTRAVENOUS | Status: DC
  Administered 2011-10-02: 999 mL via INTRAVENOUS

## 2011-10-02 MED ORDER — ONDANSETRON HCL 4 MG/2ML IJ SOLN: 4.0000 mg | Freq: Four times a day (QID) | INTRAMUSCULAR | Status: DC | PRN

## 2011-10-02 NOTE — ED Notes (Signed)
Patient states she was awaken by epigastric pain onset 130 am , vomited states the pain is getting worse will not go away

## 2011-10-02 NOTE — Consult Note (Signed)
I have seen and examined the patient and agree with the assessment and plans  

## 2011-10-02 NOTE — ED Notes (Signed)
Pt reports sharp epigastric pain starting around 0100.

## 2011-10-02 NOTE — ED Provider Notes (Signed)
History     CSN: [id number] Arrival date & time: 10/02/2011  5:46 AM   First MD Initiated Contact with Patient 10/02/11 0622      Chief Complaint  Patient presents with  . Abdominal Pain    (Consider location/radiation/quality/duration/timing/severity/associated sxs/prior treatment) Patient is a 30 y.o. female presenting with abdominal pain. The history is provided by the patient.  Abdominal Pain The primary symptoms of the illness include abdominal pain, nausea and vomiting. The primary symptoms of the illness do not include fever, shortness of breath, diarrhea, hematemesis or dysuria. The current episode started 6 to 12 hours ago. The onset of the illness was sudden. The problem has not changed since onset. The patient states that she believes she is currently not pregnant. Additional symptoms associated with the illness include heartburn. Symptoms associated with the illness do not include chills, hematuria or back pain.   patient presents with multiple episodes of vomiting associated with upper abdominal pain that awoke her from sleep at 1 AM. The episodes lasted until 5 AM and resolved spontaneously. The patient reports that she ate a burger and french fries for dinner at 9 PM last night. He reports similar episodes in the past that seem to be related to the food she eats. She took Zantac and TUMS but reports there was no relief after these and they were vomited back up. She denies any fever or chills dizziness lightheadedness cramping or diarrhea.  Past Medical History  Diagnosis Date  . Herpes     History reviewed. No pertinent past surgical history.  No family history on file.  History  Substance Use Topics  . Smoking status: Never Smoker   . Smokeless tobacco: Not on file  . Alcohol Use: Yes     Review of Systems  Constitutional: Negative for fever and chills.  HENT: Negative for nosebleeds, neck pain and neck stiffness.   Eyes: Negative for pain and visual  disturbance.  Respiratory: Negative for cough, chest tightness and shortness of breath.   Cardiovascular: Negative for chest pain, palpitations and leg swelling.  Gastrointestinal: Positive for heartburn, nausea, vomiting and abdominal pain. Negative for diarrhea, blood in stool and hematemesis.       Negative for hematemesis  Genitourinary: Negative for dysuria, hematuria and flank pain.  Musculoskeletal: Negative for back pain and gait problem.  Skin: Negative for color change and rash.  Neurological: Negative for dizziness, seizures, syncope, weakness and light-headedness.  Hematological: Does not bruise/bleed easily.  Psychiatric/Behavioral: Negative for behavioral problems and confusion.    Allergies  Review of patient's allergies indicates no known allergies.  Home Medications  No current outpatient prescriptions on file.  BP 119/73  Pulse 90  Temp(Src) 97.2 F (36.2 C) (Oral)  Resp 18  SpO2 100%  Physical Exam  Constitutional: She is oriented to person, place, and time. She appears well-developed and well-nourished.       Uncomfortable appearing  HENT:  Head: Normocephalic and atraumatic.  Eyes: Conjunctivae are normal. Pupils are equal, round, and reactive to light.  Neck: Normal range of motion. Neck supple.  Cardiovascular: Normal rate and regular rhythm.   Pulmonary/Chest: Effort normal and breath sounds normal.  Abdominal: Soft. She exhibits no distension.       Moderate tenderness to the epigastrium and right upper quadrant  Musculoskeletal: She exhibits no edema and no tenderness.  Neurological: She is alert and oriented to person, place, and time. No cranial nerve deficit. Coordination normal.  Skin: Skin is warm and dry.  No rash noted.  Psychiatric: Her behavior is normal.    ED Course  Procedures (including critical care time)  Labs Reviewed  COMPREHENSIVE METABOLIC PANEL - Abnormal; Notable for the following:    Glucose, Bld 113 (*)    Total Protein  8.4 (*)    AST 401 (*)    ALT 168 (*)    All other components within normal limits  CBC - Abnormal; Notable for the following:    WBC 13.5 (*)    Hemoglobin 11.8 (*)    HCT 34.8 (*)    All other components within normal limits  DIFFERENTIAL - Abnormal; Notable for the following:    Neutrophils Relative 89 (*)    Neutro Abs 12.1 (*)    Lymphocytes Relative 5 (*)    Lymphs Abs 0.6 (*)    All other components within normal limits  LIPASE, BLOOD  PREGNANCY, URINE   US Abdomen Complete  10/02/2011  *RADIOLOGY REPORT*  Clinical Data:  Upper abdominal pain  COMPLETE ABDOMINAL ULTRASOUND  Comparison:  CT abdomen and pelvis of 01/14/2008  Findings:  Gallbladder:  There are small echo densities within the gallbladder.  One of these probably represents a polyp, but a tiny gallstone with some sludge cannot be excluded.  There is no pain over gallbladder with compression.  Common bile duct:  The common bile duct is within upper limits of normal measuring 5.9 mm.  Correlation with liver function tests is recommended.  Liver:  The liver has a normal echogenic pattern.  There may be slight prominence of the central intrahepatic ducts.  Again correlation with liver function tests is recommended.  IVC:  Appears normal.  Pancreas:  No focal abnormality seen.  Spleen:  The spleen is normal measuring 7.4 cm sagittally.  Right Kidney:  No hydronephrosis is seen.  The right kidney measures 12.7 cm sagittally.  A cyst is present in the midportion of 1.3 cm.  Left Kidney:  No hydronephrosis.  The left kidney measures 12.8 cm.  Abdominal aorta:  The abdominal aorta is normal in caliber .  IMPRESSION:  1.  Small gallbladder polyp and possible tiny gallstone.  No evidence of acute cholecystitis. 2.  Slightly prominent common bile duct with questionable minimal dilatation of central intrahepatic ducts.  Correlate with liver function tests.  Original Report Authenticated By: Juline Patch, M.D.     1. Cholelithiasis   2.  Elevated liver enzymes       MDM  Labs an ultrasound reviewed by myself. Patient has right upper quadrant and epigastric pain in the setting of elevated liver enzymes and a questionable dilated common bile duct. Will consult surgery and plan for admission with likely ERCP to evaluate for CBD stone.  10:52am: I spoken with Caryn Bee the PA with central  surgery. He will see the patient in the emergency department.  Shaaron Adler, [place] 10/02/11 [address] [place], [place] 10/02/11 1254

## 2011-10-02 NOTE — ED Provider Notes (Signed)
Date: 10/02/2011  Rate: 89  Rhythm: normal sinus rhythm  QRS Axis: normal  Intervals: normal  ST/T Wave abnormalities: nonspecific T wave changes  Conduction Disutrbances:none  Narrative Interpretation: t wave flattening laterally  Old EKG Reviewed: unchanged  Medical screening examination/treatment/procedure(s) were performed by non-physician practitioner and as supervising physician I was immediately available for consultation/collaboration.   Olivia Mackie, MD 10/02/11 [phone]

## 2011-10-02 NOTE — ED Notes (Signed)
Got report from [place], [place]; introduced myself; no needs at this time

## 2011-10-02 NOTE — ED Notes (Signed)
Pt resting..talking on the phone

## 2011-10-02 NOTE — Consult Note (Signed)
Patricia Jenkins July 23, 1981  [id number].   Requesting MD: Dr. Norlene Campbell Chief Complaint/Reason for Consult: Chest pain, upper abdominal pain with gallstones on ultrasound HPI:  This is a 30 year old black female who is otherwise healthy. She ate a [place] burger and fries last night. Around 1 AM this morning the patient awoke with severe chest pain that radiated under her right breast into her back. She states this was constant. She had multiple episodes of nausea and vomiting. This lasted for approximately 5 hours. She then presented to the emergency department. She had further evaluation which included an abdominal ultrasound. This revealed no gallbladder wall thickening however she did have possible tiny gallstones versus a polyp. She did have a common bile duct of 5.9 mm. She is on a white blood cell count of 13,000 neutrophil count of 89%. The patient is now asymptomatic. We have been asked to evaluate the patient for possible surgical admission.  Review of systems: Please see history of present illness otherwise all other systems have been reviewed and are negative.  History reviewed. No pertinent family history.  Past Medical History  Diagnosis Date  . Herpes     Past Surgical History  Procedure Date  . Dilation and curettage of uterus     Social History:  reports that she has never smoked. She does not have any smokeless tobacco history on file. She reports that she drinks alcohol. She reports that she does not use illicit drugs.  Allergies: No Known Allergies  Medications Prior to Admission  Medication Dose Route Frequency Provider Last Rate Last Dose  . 0.9 %  sodium chloride infusion  999 mL Intravenous Continuous Shaaron Adler, PA 100 mL/hr at 10/02/11 0630 999 mL at 10/02/11 0630  . morphine 4 MG/ML injection 4 mg  4 mg Intravenous Once Shaaron Adler, PA   4 mg at 10/02/11 0754  . morphine 4 MG/ML injection 4 mg  4 mg Intravenous Once Shaaron Adler, PA    4 mg at 10/02/11 1029  . ondansetron (ZOFRAN) injection 4 mg  4 mg Intravenous Once Shaaron Adler, PA   4 mg at 10/02/11 0754  . sodium chloride 0.9 % bolus 500 mL  500 mL Intravenous Once Shaaron Adler, PA   500 mL at 10/02/11 0630  . DISCONTD: fentaNYL (SUBLIMAZE) 0.05 MG/ML injection           . DISCONTD: HYDROmorphone (DILAUDID) 1 MG/ML injection           . DISCONTD: ondansetron (ZOFRAN) 4 MG/2ML injection            No current outpatient prescriptions on file as of 10/02/2011.    Blood pressure 119/73, pulse 90, temperature 97.2 F (36.2 C), temperature source Oral, resp. rate 18, SpO2 100.00%. BP 119/73  Pulse 90  Temp(Src) 97.2 F (36.2 C) (Oral)  Resp 18  SpO2 100%  General Appearance:    Alert, cooperative, no distress, appears stated age  Head:    Normocephalic, without obvious abnormality, atraumatic  Eyes:    PERRL, conjunctiva/corneas clear  Ears:    No obvious masses or lesions  Nose:   No obvious masses or lesion  Throat:   Lips, mucosa, and tongue normal        Lungs:     Clear to auscultation bilaterally, respirations unlabored      Heart:    Regular rate and rhythm, S1 and S2 normal, no murmur, rub   or gallop, palpable carotid, radial,  and pedal pulses bilaterally.     Abdomen:     Soft, non-tender, bowel sounds active all four quadrants,    no masses, no organomegaly, nondistended        Extremities:   Extremities normal, atraumatic, no cyanosis or edema     Skin:   Skin color, texture, turgor normal, no rashes or lesions     Psych:   Alert and oriented x3 with an appropriate affect      Results for orders placed during the hospital encounter of 10/02/11 (from the past 48 hour(s))  COMPREHENSIVE METABOLIC PANEL     Status: Abnormal   Collection Time   10/02/11  6:45 AM      Component Value Range Comment   Sodium 138  135 - 145 (mEq/L)    Potassium 4.5  3.5 - 5.1 (mEq/L)    Chloride 103  96 - 112 (mEq/L)    CO2 24  19 - 32  (mEq/L)    Glucose, Bld 113 (*) 70 - 99 (mg/dL)    BUN 9  6 - 23 (mg/dL)    Creatinine, Ser 9.60  0.50 - 1.10 (mg/dL)    Calcium 45.4  8.4 - 10.5 (mg/dL)    Total Protein 8.4 (*) 6.0 - 8.3 (g/dL)    Albumin 4.2  3.5 - 5.2 (g/dL)    AST 098 (*) 0 - 37 (U/L)    ALT 168 (*) 0 - 35 (U/L)    Alkaline Phosphatase 90  39 - 117 (U/L)    Total Bilirubin 0.4  0.3 - 1.2 (mg/dL)    GFR calc non Af Amer >90  >90 (mL/min)    GFR calc Af Amer >90  >90 (mL/min)   CBC     Status: Abnormal   Collection Time   10/02/11  6:45 AM      Component Value Range Comment   WBC 13.5 (*) 4.0 - 10.5 (K/uL)    RBC 4.39  3.87 - 5.11 (MIL/uL)    Hemoglobin 11.8 (*) 12.0 - 15.0 (g/dL)    HCT 11.9 (*) 14.7 - 46.0 (%)    MCV 79.3  78.0 - 100.0 (fL)    MCH 26.9  26.0 - 34.0 (pg)    MCHC 33.9  30.0 - 36.0 (g/dL)    RDW 82.9  56.2 - 13.0 (%)    Platelets 274  150 - 400 (K/uL)   DIFFERENTIAL     Status: Abnormal   Collection Time   10/02/11  6:45 AM      Component Value Range Comment   Neutrophils Relative 89 (*) 43 - 77 (%)    Neutro Abs 12.1 (*) 1.7 - 7.7 (K/uL)    Lymphocytes Relative 5 (*) 12 - 46 (%)    Lymphs Abs 0.6 (*) 0.7 - 4.0 (K/uL)    Monocytes Relative 6  3 - 12 (%)    Monocytes Absolute 0.8  0.1 - 1.0 (K/uL)    Eosinophils Relative 0  0 - 5 (%)    Eosinophils Absolute 0.0  0.0 - 0.7 (K/uL)    Basophils Relative 0  0 - 1 (%)    Basophils Absolute 0.0  0.0 - 0.1 (K/uL)   LIPASE, BLOOD     Status: Normal   Collection Time   10/02/11  6:45 AM      Component Value Range Comment   Lipase 25  11 - 59 (U/L)   PREGNANCY, URINE     Status: Normal   Collection Time  10/02/11  6:54 AM      Component Value Range Comment   Preg Test, Ur NEGATIVE      US Abdomen Complete  10/02/2011  *RADIOLOGY REPORT*  Clinical Data:  Upper abdominal pain  COMPLETE ABDOMINAL ULTRASOUND  Comparison:  CT abdomen and pelvis of 01/14/2008  Findings:  Gallbladder:  There are small echo densities within the gallbladder.  One  of these probably represents a polyp, but a tiny gallstone with some sludge cannot be excluded.  There is no pain over gallbladder with compression.  Common bile duct:  The common bile duct is within upper limits of normal measuring 5.9 mm.  Correlation with liver function tests is recommended.  Liver:  The liver has a normal echogenic pattern.  There may be slight prominence of the central intrahepatic ducts.  Again correlation with liver function tests is recommended.  IVC:  Appears normal.  Pancreas:  No focal abnormality seen.  Spleen:  The spleen is normal measuring 7.4 cm sagittally.  Right Kidney:  No hydronephrosis is seen.  The right kidney measures 12.7 cm sagittally.  A cyst is present in the midportion of 1.3 cm.  Left Kidney:  No hydronephrosis.  The left kidney measures 12.8 cm.  Abdominal aorta:  The abdominal aorta is normal in caliber .  IMPRESSION:  1.  Small gallbladder polyp and possible tiny gallstone.  No evidence of acute cholecystitis. 2.  Slightly prominent common bile duct with questionable minimal dilatation of central intrahepatic ducts.  Correlate with liver function tests.  Original Report Authenticated By: Juline Patch, M.D.       Assessment/Plan 1. Biliary colic, questionable cholelithiasis  Plan: I informed the patient that given the fact she has had several prior episodes, she is likely to have a recurrent episode of biliary colic. We did recommend surgical admission for possible laparoscopic cholecystectomy. The patient has never had surgery and is in disbelief. She is currently contacting multiple family members to decide what she would like to do. I have discussed this with Dr.Blackman and he feels that if the patient is pain-free and can tolerate a diet she may go home and come and see [place] as an outpatient for elective resection. I have also discussed this plan with the patient as well. Once she decides which option she would like to pursue we will then make further  recommendations.  Runette Scifres E 10/02/2011, 11:34 AM

## 2011-10-02 NOTE — ED Notes (Signed)
Transported to ultrasound

## 2011-10-03 ENCOUNTER — Encounter (HOSPITAL_COMMUNITY): Admission: EM | Disposition: A | Discharge status: Home / Self Care | Payer: Self-pay | Source: Home / Self Care

## 2011-10-03 ENCOUNTER — Inpatient Hospital Stay: Admit: 2011-10-03 | Payer: Self-pay | Admitting: Surgery

## 2011-10-03 ENCOUNTER — Other Ambulatory Visit (INDEPENDENT_AMBULATORY_CARE_PROVIDER_SITE_OTHER): Payer: Self-pay | Admitting: Surgery

## 2011-10-03 ENCOUNTER — Inpatient Hospital Stay (HOSPITAL_COMMUNITY): Payer: Medicaid Other | Admitting: Anesthesiology

## 2011-10-03 ENCOUNTER — Encounter (HOSPITAL_COMMUNITY): Payer: Self-pay | Admitting: *Deleted

## 2011-10-03 ENCOUNTER — Encounter (HOSPITAL_COMMUNITY): Payer: Self-pay | Admitting: Anesthesiology

## 2011-10-03 HISTORY — PX: CHOLECYSTECTOMY: SHX55

## 2011-10-03 LAB — COMPREHENSIVE METABOLIC PANEL
ALT: 82 U/L — ABNORMAL HIGH (ref 0–35)
Alkaline Phosphatase: 70 U/L (ref 39–117)
CO2: 23 mEq/L (ref 19–32)
Calcium: 8.4 mg/dL (ref 8.4–10.5)
Chloride: 104 mEq/L (ref 96–112)
GFR calc Af Amer: >90 mL/min (ref >90)
GFR calc non Af Amer: >90 mL/min (ref >90)
Glucose, Bld: 96 mg/dL (ref 70–99)
Sodium: 137 mEq/L (ref 135–145)
Total Bilirubin: 0.4 mg/dL (ref 0.3–1.2)

## 2011-10-03 LAB — CBC
Hemoglobin: 10.2 g/dL — ABNORMAL LOW (ref 12.0–15.0)
MCH: 26.4 pg (ref 26.0–34.0)
RBC: 3.87 MIL/uL (ref 3.87–5.11)
WBC: 6.2 10*3/uL (ref 4.0–10.5)

## 2011-10-03 LAB — SURGICAL PCR SCREEN: MRSA, PCR: NEGATIVE

## 2011-10-03 SURGERY — LAPAROSCOPIC CHOLECYSTECTOMY WITH INTRAOPERATIVE CHOLANGIOGRAM
Anesthesia: General | Site: Abdomen | Wound class: Clean

## 2011-10-03 MED ORDER — KETOROLAC TROMETHAMINE 30 MG/ML IJ SOLN
INTRAMUSCULAR | Status: DC | PRN
  Administered 2011-10-03: 30 mg via INTRAVENOUS

## 2011-10-03 MED ORDER — MIDAZOLAM HCL 5 MG/5ML IJ SOLN
INTRAMUSCULAR | Status: DC | PRN
  Administered 2011-10-03: 2 mg via INTRAVENOUS

## 2011-10-03 MED ORDER — SODIUM CHLORIDE 0.9 % IR SOLN
Status: DC | PRN
  Administered 2011-10-03 (×2): 1000 mL

## 2011-10-03 MED ORDER — KCL IN DEXTROSE-NACL 20-5-0.9 MEQ/L-%-% IV SOLN
INTRAVENOUS | Status: DC
  Filled 2011-10-03 (×3): qty 1000

## 2011-10-03 MED ORDER — HYDROMORPHONE HCL PF 1 MG/ML IJ SOLN
0.2500 mg | INTRAMUSCULAR | Status: DC | PRN
  Administered 2011-10-03 (×4): 0.5 mg via INTRAVENOUS

## 2011-10-03 MED ORDER — KETOROLAC TROMETHAMINE 30 MG/ML IJ SOLN
30.0000 mg | Freq: Four times a day (QID) | INTRAMUSCULAR | Status: DC
  Administered 2011-10-03 – 2011-10-04 (×3): 30 mg via INTRAVENOUS
  Filled 2011-10-03 (×7): qty 1

## 2011-10-03 MED ORDER — NEOSTIGMINE METHYLSULFATE 1 MG/ML IJ SOLN
INTRAMUSCULAR | Status: DC | PRN
  Administered 2011-10-03: 4 mg via INTRAVENOUS

## 2011-10-03 MED ORDER — ROCURONIUM BROMIDE 100 MG/10ML IV SOLN
INTRAVENOUS | Status: DC | PRN
  Administered 2011-10-03: 35 mg via INTRAVENOUS

## 2011-10-03 MED ORDER — ONDANSETRON HCL 4 MG/2ML IJ SOLN: 4.0000 mg | Freq: Four times a day (QID) | INTRAMUSCULAR | Status: DC | PRN

## 2011-10-03 MED ORDER — PROPOFOL 10 MG/ML IV EMUL
INTRAVENOUS | Status: DC | PRN
  Administered 2011-10-03: 200 mg via INTRAVENOUS

## 2011-10-03 MED ORDER — LACTATED RINGERS IV SOLN
INTRAVENOUS | Status: DC
  Administered 2011-10-03 (×2): via INTRAVENOUS

## 2011-10-03 MED ORDER — OXYCODONE-ACETAMINOPHEN 5-325 MG PO TABS
1.0000 | ORAL_TABLET | ORAL | Status: DC | PRN
  Administered 2011-10-03 – 2011-10-04 (×2): 2 via ORAL
  Administered 2011-10-04: 1 via ORAL
  Administered 2011-10-04: 2 via ORAL
  Filled 2011-10-03 (×4): qty 2

## 2011-10-03 MED ORDER — ONDANSETRON HCL 4 MG/2ML IJ SOLN
INTRAMUSCULAR | Status: DC | PRN
  Administered 2011-10-03: 4 mg via INTRAVENOUS

## 2011-10-03 MED ORDER — FENTANYL CITRATE 0.05 MG/ML IJ SOLN
INTRAMUSCULAR | Status: DC | PRN
  Administered 2011-10-03 (×2): 100 ug via INTRAVENOUS

## 2011-10-03 MED ORDER — GLYCOPYRROLATE 0.2 MG/ML IJ SOLN
INTRAMUSCULAR | Status: DC | PRN
  Administered 2011-10-03: .6 mg via INTRAVENOUS

## 2011-10-03 MED ORDER — BUPIVACAINE-EPINEPHRINE 0.25% -1:200000 IJ SOLN
INTRAMUSCULAR | Status: DC | PRN
  Administered 2011-10-03: 20 mL

## 2011-10-03 SURGICAL SUPPLY — 36 items
APL SKNCLS STERI-STRIP NONHPOA (GAUZE/BANDAGES/DRESSINGS) ×1
APPLIER CLIP 5 13 M/L LIGAMAX5 (MISCELLANEOUS) ×2
APR CLP MED LRG 5 ANG JAW (MISCELLANEOUS) ×1
BAG SPEC RTRVL LRG 6X4 10 (ENDOMECHANICALS)
BANDAGE ADHESIVE 1X3 (GAUZE/BANDAGES/DRESSINGS) ×8 IMPLANT
BENZOIN TINCTURE PRP APPL 2/3 (GAUZE/BANDAGES/DRESSINGS) ×2 IMPLANT
CANISTER SUCTION 2500CC (MISCELLANEOUS) ×2 IMPLANT
CHLORAPREP W/TINT 26ML (MISCELLANEOUS) ×2 IMPLANT
CLIP APPLIE 5 13 M/L LIGAMAX5 (MISCELLANEOUS) ×1 IMPLANT
CLOTH BEACON ORANGE TIMEOUT ST (SAFETY) ×2 IMPLANT
COVER MAYO STAND STRL (DRAPES) IMPLANT
COVER SURGICAL LIGHT HANDLE (MISCELLANEOUS) ×2 IMPLANT
DECANTER SPIKE VIAL GLASS SM (MISCELLANEOUS) ×2 IMPLANT
DRAPE C-ARM 42X72 X-RAY (DRAPES) IMPLANT
ELECT REM PT RETURN 9FT ADLT (ELECTROSURGICAL) ×2
ELECTRODE REM PT RTRN 9FT ADLT (ELECTROSURGICAL) ×1 IMPLANT
GLOVE SURG SIGNA 7.5 PF LTX (GLOVE) ×2 IMPLANT
GOWN PREVENTION PLUS XLARGE (GOWN DISPOSABLE) ×2 IMPLANT
GOWN STRL NON-REIN LRG LVL3 (GOWN DISPOSABLE) ×6 IMPLANT
KIT BASIN OR (CUSTOM PROCEDURE TRAY) ×2 IMPLANT
KIT ROOM TURNOVER OR (KITS) ×2 IMPLANT
NS IRRIG 1000ML POUR BTL (IV SOLUTION) ×2 IMPLANT
PAD ARMBOARD 7.5X6 YLW CONV (MISCELLANEOUS) ×2 IMPLANT
POUCH SPECIMEN RETRIEVAL 10MM (ENDOMECHANICALS) IMPLANT
SCISSORS LAP 5X35 DISP (ENDOMECHANICALS) IMPLANT
SET CHOLANGIOGRAPH 5 50 .035 (SET/KITS/TRAYS/PACK) IMPLANT
SET IRRIG TUBING LAPAROSCOPIC (IRRIGATION / IRRIGATOR) ×2 IMPLANT
SLEEVE ENDOPATH XCEL 5M (ENDOMECHANICALS) ×4 IMPLANT
SPECIMEN JAR SMALL (MISCELLANEOUS) ×2 IMPLANT
SUT MON AB 4-0 PC3 18 (SUTURE) ×2 IMPLANT
TOWEL OR 17X24 6PK STRL BLUE (TOWEL DISPOSABLE) ×2 IMPLANT
TOWEL OR 17X26 10 PK STRL BLUE (TOWEL DISPOSABLE) ×2 IMPLANT
TRAY LAPAROSCOPIC (CUSTOM PROCEDURE TRAY) ×2 IMPLANT
TROCAR XCEL BLUNT TIP 100MML (ENDOMECHANICALS) ×2 IMPLANT
TROCAR XCEL NON-BLD 5MMX100MML (ENDOMECHANICALS) ×2 IMPLANT
WATER STERILE IRR 1000ML POUR (IV SOLUTION) IMPLANT

## 2011-10-03 NOTE — Progress Notes (Signed)
I have seen and examined the patient and agree with the assessment and plans  

## 2011-10-03 NOTE — Anesthesia Postprocedure Evaluation (Signed)
Anesthesia Post Note  Patient: Patricia Jenkins  Procedure(s) Performed:  LAPAROSCOPIC CHOLECYSTECTOMY WITH INTRAOPERATIVE CHOLANGIOGRAM  Anesthesia type: General  Patient location: PACU  Post pain: Pain level controlled and Adequate analgesia  Post assessment: Post-op Vital signs reviewed, Patient's Cardiovascular Status Stable, Respiratory Function Stable, Patent Airway and Pain level controlled  Last Vitals:  Filed Vitals:   10/03/11 1730  BP:   Pulse: 82  Temp:   Resp: 14    Post vital signs: Reviewed and stable  Level of consciousness: awake, alert  and oriented  Complications: No apparent anesthesia complications

## 2011-10-03 NOTE — Progress Notes (Signed)
I discussed the procedure in detail.  The patient was given educational material.  We discussed the risks and benefits of a laparoscopic cholecystectomy and possible cholangiogram including, but not limited to bleeding, infection, injury to surrounding structures such as the intestine or liver, bile leak, retained gallstones, need to convert to an open procedure, prolonged diarrhea, blood clots such as  DVT, common bile duct injury, anesthesia risks, and possible need for additional procedures.  The likelihood of improvement in symptoms and return to the patient's normal status is good. We discussed the typical post-operative recovery course. 

## 2011-10-03 NOTE — Transfer of Care (Signed)
Immediate Anesthesia Transfer of Care Note  Patient: Patricia Jenkins  Procedure(s) Performed:  LAPAROSCOPIC CHOLECYSTECTOMY WITH INTRAOPERATIVE CHOLANGIOGRAM  Patient Location: PACU  Anesthesia Type: General  Level of Consciousness: awake and alert   Airway & Oxygen Therapy: Patient Spontanous Breathing and Patient connected to nasal cannula oxygen  Post-op Assessment: Report given to PACU RN  Post vital signs: Reviewed and stable  Complications: No apparent anesthesia complications

## 2011-10-03 NOTE — Progress Notes (Signed)
Call to dr. Magnus Ivan who asked me to call office.  Call to office x2 once by myself and once by liz  to report lab unable to use specimen sample from or as was given to them.  They did say sample could be used for histology. No answer either time.

## 2011-10-03 NOTE — ED Provider Notes (Signed)
Medical screening examination/treatment/procedure(s) were performed by non-physician practitioner and as supervising physician I was immediately available for consultation/collaboration.  Olivia Mackie, MD 10/03/11 [phone]

## 2011-10-03 NOTE — Anesthesia Preprocedure Evaluation (Addendum)
Anesthesia Evaluation  Patient identified by MRN, date of birth, ID band Patient awake    Reviewed: Allergy & Precautions, H&P , NPO status , Patient's Chart, lab work & pertinent test results  Airway Mallampati: II  Neck ROM: full    Dental   Pulmonary pneumonia ,          Cardiovascular     Neuro/Psych    GI/Hepatic   Endo/Other    Renal/GU      Musculoskeletal   Abdominal   Peds  Hematology   Anesthesia Other Findings   Reproductive/Obstetrics                           Anesthesia Physical Anesthesia Plan  ASA: II  Anesthesia Plan: General   Post-op Pain Management:    Induction: Intravenous  Airway Management Planned: Oral ETT  Additional Equipment:   Intra-op Plan:   Post-operative Plan: Extubation in OR  Informed Consent: I have reviewed the patients History and Physical, chart, labs and discussed the procedure including the risks, benefits and alternatives for the proposed anesthesia with the patient or authorized representative who has indicated his/her understanding and acceptance.     Plan Discussed with: CRNA, Surgeon and Anesthesiologist  Anesthesia Plan Comments:        Anesthesia Quick Evaluation

## 2011-10-03 NOTE — Op Note (Signed)
Laparoscopic Cholecystectomy Procedure Note  Indications: This patient presents with symptomatic gallbladder disease and will undergo laparoscopic cholecystectomy.  Pre-operative Diagnosis: cholelithiasis with cholecystitis  Post-operative Diagnosis: Same  Surgeon: Abigail Miyamoto A   Assistants:  Anesthesia: General endotracheal anesthesia  ASA Class: 1  Procedure Details  The patient was seen again in the Holding Room. The risks, benefits, complications, treatment options, and expected outcomes were discussed with the patient. The possibilities of reaction to medication, pulmonary aspiration, perforation of viscus, bleeding, recurrent infection, finding a normal gallbladder, the need for additional procedures, failure to diagnose a condition, the possible need to convert to an open procedure, and creating a complication requiring transfusion or operation were discussed with the patient. The likelihood of improving the patient's symptoms with return to their baseline status is good.  The patient and/or family concurred with the proposed plan, giving informed consent. The site of surgery properly noted. The patient was taken to Operating Room, identified as Patricia Jenkins and the procedure verified as Laparoscopic Cholecystectomy with Intraoperative Cholangiogram. A Time Out was held and the above information confirmed.  Prior to the induction of general anesthesia, antibiotic prophylaxis was administered. General endotracheal anesthesia was then administered and tolerated well. After the induction, the abdomen was prepped with Chloraprep and draped in sterile fashion. The patient was positioned in the supine position.  Local anesthetic agent was injected into the skin near the umbilicus and an incision made. We dissected down to the abdominal fascia with blunt dissection.  The fascia was incised vertically and we entered the peritoneal cavity bluntly.  A pursestring suture of 0-Vicryl was placed  around the fascial opening.  The Hasson cannula was inserted and secured with the stay suture.  Pneumoperitoneum was then created with CO2 and tolerated well without any adverse changes in the patient's vital signs. An 11-mm port was placed in the subxiphoid position.  Two 5-mm ports were placed in the right upper quadrant. All skin incisions were infiltrated with a local anesthetic agent before making the incision and placing the trocars.   We positioned the patient in reverse Trendelenburg, tilted slightly to the patient's left.  The gallbladder was identified, the fundus grasped and retracted cephalad. Adhesions were lysed bluntly and with the electrocautery where indicated, taking care not to injure any adjacent organs or viscus. The infundibulum was grasped and retracted laterally, exposing the peritoneum overlying the triangle of Calot. This was then divided and exposed in a blunt fashion. The cystic duct was clearly identified and bluntly dissected circumferentially. A critical view of the cystic duct and cystic artery was obtained.  The cystic duct was then ligated with clips and divided. The cystic artery was, dissected free, ligated with clips and divided as well.   The gallbladder was dissected from the liver bed in retrograde fashion with the electrocautery. The gallbladder was removed and placed in an Endocatch sac. The liver bed was irrigated and inspected. Hemostasis was achieved with the electrocautery. Copious irrigation was utilized and was repeatedly aspirated until clear.  The gallbladder and Endocatch sac were then removed through the umbilical port site.  The pursestring suture was used to close the umbilical fascia.    We again inspected the right upper quadrant for hemostasis.  Pneumoperitoneum was released as we removed the trocars.  4-0 Monocryl was used to close the skin.   Benzoin, steri-strips, and clean dressings were applied. The patient was then extubated and brought to the  recovery room in stable condition. Instrument, sponge, and needle  counts were correct at closure and at the conclusion of the case.   Findings: Cholecystitis with Cholelithiasis  Estimated Blood Loss: Minimal         Drains: 0         Specimens: Gallbladder           Complications: None; patient tolerated the procedure well.         Disposition: PACU - hemodynamically stable.         Condition: stable

## 2011-10-03 NOTE — Preoperative (Signed)
Beta Blockers   Reason not to administer Beta Blockers:Not Applicable 

## 2011-10-03 NOTE — Anesthesia Procedure Notes (Addendum)
Procedure Name: Intubation Date/Time: 10/03/2011 4:07 PM Performed by: Caryn Bee Pre-anesthesia Checklist: Patient identified, Emergency Drugs available, Suction available, Patient being monitored and Timeout performed Patient Re-evaluated:Patient Re-evaluated prior to inductionOxygen Delivery Method: Circle System Utilized Preoxygenation: Pre-oxygenation with 100% oxygen Intubation Type: IV induction Ventilation: Mask ventilation without difficulty Laryngoscope Size: Mac and 3 Grade View: Grade I Tube type: Oral Tube size: 7.5 mm Number of attempts: 1 Airway Equipment and Method: stylet Placement Confirmation: ETT inserted through vocal cords under direct vision,  positive ETCO2 and breath sounds checked- equal and bilateral Secured at: 21 cm Tube secured with: Tape Dental Injury: Teeth and Oropharynx as per pre-operative assessment

## 2011-10-03 NOTE — Progress Notes (Signed)
  Subjective: Pt without complaints this am.  I explained surgery to the patient and why she needs that vs an ERCP.  She and her BF are now clear and understand.  Objective: Vital signs in last 24 hours: Temp:  [97.6 F (36.4 C)-98.5 F (36.9 C)] 97.9 F (36.6 C) (11/15 0600) Pulse Rate:  [80-98] 90  (11/15 0600) Resp:  [16-20] 18  (11/15 0600) BP: (108-129)/(60-83) 119/69 mmHg (11/15 0600) SpO2:  [99 %-100 %] 100 % (11/15 0600) Weight:  [215 lb 9.6 oz (97.796 kg)] 215 lb 9.6 oz (97.796 kg) (11/14 1553) Last BM Date: 10/01/11  Intake/Output from previous day:   Intake/Output this shift:    PE: Abd: soft, NT, ND, +BS Ht: regular Lungs: CTAB  Lab Results:   [facility] 10/03/11 0650 10/02/11 0645  WBC 6.2 13.5*  HGB 10.2* 11.8*  HCT 31.1* 34.8*  PLT 226 274   BMET  Basename 10/02/11 0645  NA 138  K 4.5  CL 103  CO2 24  GLUCOSE 113*  BUN 9  CREATININE 0.64  CALCIUM 10.1   PT/INR No results found for this basename: LABPROT:2,INR:2 in the last 72 hours   Studies/Results: US Abdomen Complete  10/02/2011  *RADIOLOGY REPORT*  Clinical Data:  Upper abdominal pain  COMPLETE ABDOMINAL ULTRASOUND  Comparison:  CT abdomen and pelvis of 01/14/2008  Findings:  Gallbladder:  There are small echo densities within the gallbladder.  One of these probably represents a polyp, but a tiny gallstone with some sludge cannot be excluded.  There is no pain over gallbladder with compression.  Common bile duct:  The common bile duct is within upper limits of normal measuring 5.9 mm.  Correlation with liver function tests is recommended.  Liver:  The liver has a normal echogenic pattern.  There may be slight prominence of the central intrahepatic ducts.  Again correlation with liver function tests is recommended.  IVC:  Appears normal.  Pancreas:  No focal abnormality seen.  Spleen:  The spleen is normal measuring 7.4 cm sagittally.  Right Kidney:  No hydronephrosis is seen.  The right kidney  measures 12.7 cm sagittally.  A cyst is present in the midportion of 1.3 cm.  Left Kidney:  No hydronephrosis.  The left kidney measures 12.8 cm.  Abdominal aorta:  The abdominal aorta is normal in caliber .  IMPRESSION:  1.  Small gallbladder polyp and possible tiny gallstone.  No evidence of acute cholecystitis. 2.  Slightly prominent common bile duct with questionable minimal dilatation of central intrahepatic ducts.  Correlate with liver function tests.  Original Report Authenticated By: Juline Patch, M.D.    Anti-infectives: Anti-infectives     Start     Dose/Rate Route Frequency Ordered Stop   10/02/11 1545  piperacillin-tazobactam (ZOSYN) IVPB 3.375 g       3.375 g 12.5 mL/hr over 240 Minutes Intravenous 3 times per day 10/02/11 1537             Assessment/Plan  1. Biliary colic  Plan:  Pt agreeable to surgery today.  I d/w pt and BF risks and complications including but not limited to bleeding, infection, post op bile leak, post op abscess, CBD injury, injury to surrounding bowel or organs, and possible need for open procedure.  They understand and wish to proceed.  I also d/w pt the chances of improvement as well.   LOS: 1 day    Patricia Jenkins E 10/03/2011

## 2011-10-04 LAB — COMPREHENSIVE METABOLIC PANEL
ALT: 69 U/L — ABNORMAL HIGH (ref 0–35)
Alkaline Phosphatase: 72 U/L (ref 39–117)
CO2: 26 mEq/L (ref 19–32)
Calcium: 8.6 mg/dL (ref 8.4–10.5)
GFR calc Af Amer: >90 mL/min (ref >90)
GFR calc non Af Amer: >90 mL/min (ref >90)
Glucose, Bld: 86 mg/dL (ref 70–99)
Sodium: 138 mEq/L (ref 135–145)

## 2011-10-04 MED ORDER — LACTATED RINGERS IV SOLN
INTRAVENOUS | Status: DC
  Administered 2011-10-04: 04:00:00 via INTRAVENOUS

## 2011-10-04 MED ORDER — OXYCODONE-ACETAMINOPHEN 5-325 MG PO TABS: 1.0000 | ORAL_TABLET | ORAL | Status: AC | PRN

## 2011-10-04 MED ORDER — ACETAMINOPHEN 325 MG PO TABS: 650.0000 mg | ORAL_TABLET | ORAL | Status: DC | PRN

## 2011-10-04 MED ORDER — ONDANSETRON HCL 4 MG PO TABS: 4.0000 mg | ORAL_TABLET | Freq: Four times a day (QID) | ORAL | Status: DC | PRN

## 2011-10-04 MED ORDER — MORPHINE SULFATE 2 MG/ML IJ SOLN: 2.0000 mg | INTRAMUSCULAR | Status: DC | PRN

## 2011-10-04 MED ORDER — HYDROCODONE-ACETAMINOPHEN 5-325 MG PO TABS: 1.0000 | ORAL_TABLET | ORAL | Status: DC | PRN

## 2011-10-04 MED ORDER — ENOXAPARIN SODIUM 40 MG/0.4ML ~~LOC~~ SOLN
40.0000 mg | SUBCUTANEOUS | Status: DC
  Administered 2011-10-04: 40 mg via SUBCUTANEOUS
  Filled 2011-10-04: qty 0.4

## 2011-10-04 MED ORDER — ONDANSETRON HCL 4 MG/2ML IJ SOLN: 4.0000 mg | Freq: Four times a day (QID) | INTRAMUSCULAR | Status: DC | PRN

## 2011-10-04 NOTE — Discharge Summary (Signed)
Physician Discharge Summary  Patient ID: JAHNIYAH REVERE MRN: [id number] DOB/AGE: [birth year] 29 y.o.  Admit date: 10/02/2011 Discharge date: 10/04/2011  Admission Diagnoses: Biliary colic Discharge Diagnoses:  Principal Problem:  *Biliary colic Active Problems:  Cholelithiasis  Procedure(s): LAPAROSCOPIC CHOLECYSTECTOMY WITH INTRAOPERATIVE CHOLANGIOGRAM  Discharged Condition: good  Hospital Course: Pt admitted on 11/14 after presenting to ER with c/o abdominal pain and workup finding gallstone. She was diagnosed with bil colic but due to her recurrent pain, she needed admission. She was then taken to the OR on 11/15 for lap chole. No intra-op or post op issues/complications. She is eating ok, voiding, and pain control is adequate.  Consults:    Discharge Exam: Blood pressure 111/60, pulse 80, temperature 98 F (36.7 C), temperature source Oral, resp. rate 18, height 5\' 10"  (1.778 m), weight 215 lb (97.523 kg), SpO2 100.00%. Lungs: CTA without w/r/r Heart: Regular Abdomen: soft, ND, appropriately tender   Incisions all c/d/i without erythema or hematoma. Ext: No edema or tenderness   Disposition: Home or Self Care   .   SignedMarianna Fuss 10/04/2011, 2:16 PM

## 2011-10-04 NOTE — Discharge Summary (Signed)
I have seen and examined the patient and agree with the assessment and plans  

## 2011-10-04 NOTE — Progress Notes (Signed)
I have seen and examined the patient and agree with the assessment and plans  

## 2011-10-04 NOTE — Progress Notes (Signed)
1 Day Post-Op  Subjective: Pt ok. Moderately sore. Some nausea, hasn't eaten much Voiding ok.  Objective: Vital signs in last 24 hours: Temp:  [97.7 F (36.5 C)-98.9 F (37.2 C)] 98.2 F (36.8 C) (11/16 0553) Pulse Rate:  [73-97] 86  (11/16 0553) Resp:  [11-24] 18  (11/16 0553) BP: (99-131)/(53-82) 101/60 mmHg (11/16 0553) SpO2:  [98 %-100 %] 100 % (11/16 0553) Weight:  [215 lb (97.523 kg)] 215 lb (97.523 kg) (11/15 0956) Last BM Date: 10/01/11  Intake/Output this shift:    Physical Exam: Lungs: CTA without w/r/r Heart: Regular Abdomen: soft, ND, appropriately tender   Incisions all c/d/i without erythema or hematoma. Ext: No edema or tenderness   Labs: CBC  Basename 10/03/11 0650 10/02/11 0645  WBC 6.2 13.5*  HGB 10.2* 11.8*  HCT 31.1* 34.8*  PLT 226 274   BMET  Basename 10/04/11 0515 10/03/11 0650  NA 138 137  K 3.5 3.5  CL 106 104  CO2 26 23  GLUCOSE 86 96  BUN 5* 4*  CREATININE 0.74 0.64  CALCIUM 8.6 8.4   LFT  Basename 10/04/11 0515 10/02/11 0645  PROT 6.8 --  ALBUMIN 3.2* --  AST 54* --  ALT 69* --  ALKPHOS 72 --  BILITOT 0.4 --  BILIDIR -- --  IBILI -- --  LIPASE -- 25   PT/INR No results found for this basename: LABPROT:2,INR:2 in the last 72 hours ABG No results found for this basename: PHART:2,PCO2:2,PO2:2,HCO3:2 in the last 72 hours  Studies/Results: US Abdomen Complete  10/02/2011  *RADIOLOGY REPORT*  Clinical Data:  Upper abdominal pain  COMPLETE ABDOMINAL ULTRASOUND  Comparison:  CT abdomen and pelvis of 01/14/2008  Findings:  Gallbladder:  There are small echo densities within the gallbladder.  One of these probably represents a polyp, but a tiny gallstone with some sludge cannot be excluded.  There is no pain over gallbladder with compression.  Common bile duct:  The common bile duct is within upper limits of normal measuring 5.9 mm.  Correlation with liver function tests is recommended.  Liver:  The liver has a normal echogenic  pattern.  There may be slight prominence of the central intrahepatic ducts.  Again correlation with liver function tests is recommended.  IVC:  Appears normal.  Pancreas:  No focal abnormality seen.  Spleen:  The spleen is normal measuring 7.4 cm sagittally.  Right Kidney:  No hydronephrosis is seen.  The right kidney measures 12.7 cm sagittally.  A cyst is present in the midportion of 1.3 cm.  Left Kidney:  No hydronephrosis.  The left kidney measures 12.8 cm.  Abdominal aorta:  The abdominal aorta is normal in caliber .  IMPRESSION:  1.  Small gallbladder polyp and possible tiny gallstone.  No evidence of acute cholecystitis. 2.  Slightly prominent common bile duct with questionable minimal dilatation of central intrahepatic ducts.  Correlate with liver function tests.  Original Report Authenticated By: Juline Patch, M.D.    Assessment: Principal Problem:  *Biliary colic Active Problems:  Cholelithiasis  s/p Procedure(s): LAPAROSCOPIC CHOLECYSTECTOMY WITH INTRAOPERATIVE CHOLANGIOGRAM Plan: We'll see how she does as the day progresses. If nausea subsides and she can eat better, gets OOB and does some activity, she may be able to be DC'd later today. Otherwise, cont antiemetics, encourage activity.  LOS: 2 days    Marianna Fuss 10/04/2011

## 2011-10-07 ENCOUNTER — Telehealth (INDEPENDENT_AMBULATORY_CARE_PROVIDER_SITE_OTHER): Payer: Self-pay

## 2011-10-07 NOTE — Telephone Encounter (Signed)
Called pt per blue line to check on her, she said her bleeding has stopped and she is feeling better. I gave her her follow up appt information.

## 2011-10-08 ENCOUNTER — Telehealth (INDEPENDENT_AMBULATORY_CARE_PROVIDER_SITE_OTHER): Payer: Self-pay

## 2011-10-08 ENCOUNTER — Encounter (HOSPITAL_COMMUNITY): Payer: Self-pay | Admitting: Surgery

## 2011-10-08 NOTE — Telephone Encounter (Signed)
Dr Ezzard Standing wrote for Percocet 5/325 #30 1-2po q6prn pain. I notifed the pt the rx is ready for pickup

## 2011-10-08 NOTE — Telephone Encounter (Signed)
Pt calling in requesting a refill on her Oxycodone Rx. I offered to call in the automatic rf on Hydrocodone but the pt states she can't take Hydrocodone b/c it causes severe nausea. The pt had a lap chole on 10-03-11 by Dr Magnus Ivan and it's his pm off so I am taking to the Urgent Office doctor Dr Ezzard Standing. Pt uses [facility]./ Pls call pt with answer.Patricia Jenkins

## 2011-10-15 ENCOUNTER — Encounter (INDEPENDENT_AMBULATORY_CARE_PROVIDER_SITE_OTHER): Payer: Self-pay | Admitting: Radiology

## 2011-10-15 ENCOUNTER — Ambulatory Visit (INDEPENDENT_AMBULATORY_CARE_PROVIDER_SITE_OTHER): Payer: Self-pay | Admitting: Radiology

## 2011-10-15 VITALS — BP 124/82 | HR 68 | Resp 14 | Ht 70.0 in | Wt 213.0 lb

## 2011-10-15 DIAGNOSIS — K8020 Calculus of gallbladder without cholecystitis without obstruction: Secondary | ICD-10-CM

## 2011-10-15 DIAGNOSIS — K8050 Calculus of bile duct without cholangitis or cholecystitis without obstruction: Secondary | ICD-10-CM

## 2011-10-15 NOTE — Progress Notes (Signed)
Patricia Jenkins 21-Dec-1980 [id number] 10/15/2011   Patricia Jenkins is a 30 y.o. female who had a laparoscopic cholecystectomy with intraoperative cholangiogram.  The pathology report confirmed cholecystitis.  The patient reports that they are feeling well with normal bowel movements and good appetite.  The pre-operative symptoms of abdominal pain, nausea, and vomiting have resolved.    Physical examination - Incisions appear well-healed with no sign of infection or bleeding.  Small remnant of suture seen but not causing any drainage or erythema.  Abdomen - soft, non-tender  Impression:  s/p laparoscopic cholecystectomy  Plan:  She may resume a regular diet and full activity.  She may follow-up on a PRN basis.

## 2011-10-22 ENCOUNTER — Emergency Department (INDEPENDENT_AMBULATORY_CARE_PROVIDER_SITE_OTHER)
Admission: EM | Admit: 2011-10-22 | Discharge: 2011-10-22 | Disposition: A | Discharge status: Home / Self Care | Payer: Self-pay | Source: Home / Self Care | Attending: Family Medicine | Admitting: Family Medicine

## 2011-10-22 ENCOUNTER — Encounter (HOSPITAL_COMMUNITY): Payer: Self-pay | Admitting: Emergency Medicine

## 2011-10-22 DIAGNOSIS — F3281 Premenstrual dysphoric disorder: Secondary | ICD-10-CM

## 2011-10-22 DIAGNOSIS — N943 Premenstrual tension syndrome: Secondary | ICD-10-CM

## 2011-10-22 MED ORDER — FLUOXETINE HCL 20 MG PO CAPS
20.0000 mg | ORAL_CAPSULE | Freq: Every day | ORAL | Status: DC
Start: 2011-10-22 — End: 2012-10-10

## 2011-10-22 MED ORDER — HYDROXYZINE HCL 25 MG PO TABS: 25.0000 mg | ORAL_TABLET | Freq: Three times a day (TID) | ORAL | Status: AC | PRN

## 2011-10-22 NOTE — ED Notes (Signed)
Onset yesterday of symptoms.  Irritability, mood swings, anxiety.  Patient has not been diagnosed by a physician

## 2011-10-22 NOTE — ED Provider Notes (Signed)
History     CSN: [id number] Arrival date & time: 10/22/2011  8:47 PM   First MD Initiated Contact with Patient 10/22/11 1922      Chief Complaint  Patient presents with  . Premenstrual Syndrome    (Consider location/radiation/quality/duration/timing/severity/associated sxs/prior treatment) HPI Comments: Has mood swings prior to the beginning of her period not on birth control has tubes tide. Not interested in ocp. Has used fluoxetine on prn bases. Has used this medication in the past for depression. Not suicidal or homicidal. States "i am not behaviral I just have mood swings and anxiety before my periods every month" No PCP. Feels anxious "on the edge" gets depressed easily before her periods. Going through stress. Feels frustrated about her weight time. States she does not have money to see a PCP and is not interested in seeing a behavioral health specialist. She just wants her medications refilled.  The history is provided by the patient.    Past Medical History  Diagnosis Date  . Herpes   . Pneumonia 2010    Past Surgical History  Procedure Date  . Dilation and curettage of uterus   . Cholecystectomy 10/03/2011    Procedure: LAPAROSCOPIC CHOLECYSTECTOMY WITH INTRAOPERATIVE CHOLANGIOGRAM;  Surgeon: Shelly Rubenstein, MD;  Location: [facility] OR;  Service: General;  Laterality: N/A;  . Tubal ligation     History reviewed. No pertinent family history.  History  Substance Use Topics  . Smoking status: Never Smoker   . Smokeless tobacco: Not on file  . Alcohol Use: Yes    OB History    Grav Para Term Preterm Abortions TAB SAB Ect Mult Living                  Review of Systems  Constitutional: Negative.   Psychiatric/Behavioral: Positive for sleep disturbance and dysphoric mood. Negative for suicidal ideas, hallucinations, confusion, self-injury and agitation. The patient is nervous/anxious.     Allergies  Review of patient's allergies indicates no known  allergies.  Home Medications   Current Outpatient Rx  Name Route Sig Dispense Refill  . LORAZEPAM 0.5 MG PO TABS Oral Take 0.5 mg by mouth every 8 (eight) hours.      Marland Kitchen FLUOXETINE HCL 20 MG PO CAPS Oral Take 20 mg by mouth daily.      Marland Kitchen FLUOXETINE HCL 20 MG PO CAPS Oral Take 1 capsule (20 mg total) by mouth daily. 30 capsule 0  . HYDROXYZINE HCL 25 MG PO TABS Oral Take 1 tablet (25 mg total) by mouth every 8 (eight) hours as needed for anxiety. 12 tablet 0    LMP 10/12/2011  Physical Exam  Nursing note and vitals reviewed. Constitutional: She appears well-developed and well-nourished. No distress.  Psychiatric: Her speech is normal. Judgment and thought content normal. Her affect is angry and blunt. She is combative. Cognition and memory are normal.       Patient appeared frustrated had poor eye contact. Said she was "upset about waiting time and did not feel in the mood to talk" "I just want the same medications I got refilled last time I was here please"  States she is going through a lot of stress. Is about to get her period and she feels bad every month around this time.  Denied negative thoughts about hurting herself or others.     ED Course  Procedures (including critical care time)  Labs Reviewed - No data to display No results found.   1. Premenstrual dysphoric disorder  MDM  Explained pt. I think it would be beneficial for her to establish care with a behavioral specialist in town. I could give refills of her meds but she needs to establish care with a PCP or BM specialist for follow up and further refills. Apparently patient left room prior nurse when in with discharge instructions and prescriptions.        Sharin Grave, MD 10/24/11 1432

## 2011-12-06 ENCOUNTER — Encounter (HOSPITAL_COMMUNITY): Payer: Self-pay

## 2011-12-06 ENCOUNTER — Emergency Department (HOSPITAL_COMMUNITY)
Admission: EM | Admit: 2011-12-06 | Discharge: 2011-12-06 | Disposition: A | Discharge status: Home / Self Care | Payer: Self-pay | Source: Home / Self Care | Attending: Family Medicine | Admitting: Family Medicine

## 2011-12-06 DIAGNOSIS — A6000 Herpesviral infection of urogenital system, unspecified: Secondary | ICD-10-CM

## 2011-12-06 LAB — POCT URINALYSIS DIP (DEVICE)
Ketones, ur: NEGATIVE mg/dL
Leukocytes, UA: NEGATIVE
Protein, ur: NEGATIVE mg/dL
Urobilinogen, UA: 0.2 mg/dL (ref 0.0–1.0)

## 2011-12-06 LAB — POCT PREGNANCY, URINE: Preg Test, Ur: NEGATIVE

## 2011-12-06 MED ORDER — VALACYCLOVIR HCL 500 MG PO TABS: 500.0000 mg | ORAL_TABLET | Freq: Two times a day (BID) | ORAL | Status: AC

## 2011-12-06 NOTE — ED Notes (Signed)
Has reported hist of herpes, feels as if she is having another outbreak, started her medication, but has run out; denies other STD concerns; anxious

## 2011-12-06 NOTE — ED Provider Notes (Signed)
History     CSN: [id number]  Arrival date & time 12/06/11  1158   First MD Initiated Contact with Patient 12/06/11 1158      Chief Complaint  Patient presents with  . Vaginal Itching    (Consider location/radiation/quality/duration/timing/severity/associated sxs/prior treatment) HPI Comments: Patricia Jenkins presents for evaluation of an outbreak of genital herpes. She was dx'd 12 years ago. She reports that she had 3 pills left but this has not helped. She denies any other symptoms. Her LMP was on Sunday. She is in a monogamous relationship.   Patient is a 32 y.o. female presenting with female genitourinary complaint. The history is provided by the patient.  Female GU Problem Primary symptoms include genital lesions.  Primary symptoms include no discharge, no genital itching, no genital odor, no dysuria and no vaginal bleeding. There has been no fever. The fever has been present for 3 to 4 days. This is a recurrent problem. The current episode started more than 2 days ago. The problem has not changed since onset.The symptoms occur spontaneously. She is not pregnant. She has not missed her period. Her LMP was days ago. The patient's menstrual history has been regular. Pertinent negatives include no abdominal pain, no nausea and no vomiting. Sexual activity: sexually active. There is no concern regarding sexually transmitted diseases. Associated medical issues include Herpes simplex.    Past Medical History  Diagnosis Date  . Herpes   . Pneumonia 2010    Past Surgical History  Procedure Date  . Dilation and curettage of uterus   . Cholecystectomy 10/03/2011    Procedure: LAPAROSCOPIC CHOLECYSTECTOMY WITH INTRAOPERATIVE CHOLANGIOGRAM;  Surgeon: Shelly Rubenstein, MD;  Location: [facility] OR;  Service: General;  Laterality: N/A;  . Tubal ligation     History reviewed. No pertinent family history.  History  Substance Use Topics  . Smoking status: Never Smoker   . Smokeless tobacco: Not on file    . Alcohol Use: Yes    OB History    Grav Para Term Preterm Abortions TAB SAB Ect Mult Living                  Review of Systems  Constitutional: Negative.   HENT: Negative.   Eyes: Negative.   Respiratory: Negative.   Cardiovascular: Negative.   Gastrointestinal: Negative.  Negative for nausea, vomiting and abdominal pain.  Genitourinary: Negative.  Negative for dysuria and vaginal bleeding.  Musculoskeletal: Negative.   Skin: Negative.   Neurological: Negative.     Allergies  Review of patient's allergies indicates no known allergies.  Home Medications   Current Outpatient Rx  Name Route Sig Dispense Refill  . FLUOXETINE HCL 20 MG PO CAPS Oral Take 20 mg by mouth daily.      Marland Kitchen FLUOXETINE HCL 20 MG PO CAPS Oral Take 1 capsule (20 mg total) by mouth daily. 30 capsule 0  . LORAZEPAM 0.5 MG PO TABS Oral Take 0.5 mg by mouth every 8 (eight) hours.      Marland Kitchen VALACYCLOVIR HCL 500 MG PO TABS Oral Take 1 tablet (500 mg total) by mouth 2 (two) times daily. 6 tablet 2    BP 113/75  Pulse 90  Temp(Src) 98.7 F (37.1 C) (Oral)  Resp 14  SpO2 100%  LMP 12/02/2011  Physical Exam  Nursing note and vitals reviewed. Constitutional: She is oriented to person, place, and time. She appears well-developed and well-nourished.  HENT:  Head: Normocephalic and atraumatic.  Eyes: EOM are normal.  Neck: Normal  range of motion.  Pulmonary/Chest: Effort normal.  Musculoskeletal: Normal range of motion.  Neurological: She is alert and oriented to person, place, and time.  Skin: Skin is warm and dry.  Psychiatric: Her behavior is normal.    ED Course  Procedures (including critical care time)   Labs Reviewed  POCT URINALYSIS DIP (DEVICE)  POCT PREGNANCY, URINE  POCT PREGNANCY, URINE  POCT URINALYSIS DIPSTICK   No results found.   1. Herpes genitalis       MDM  Labs reviewed; rx for valacyclovir given        Richardo Priest, MD 12/06/11 1232

## 2011-12-11 ENCOUNTER — Encounter (HOSPITAL_COMMUNITY): Payer: Self-pay | Admitting: *Deleted

## 2011-12-11 ENCOUNTER — Emergency Department (INDEPENDENT_AMBULATORY_CARE_PROVIDER_SITE_OTHER)
Admission: EM | Admit: 2011-12-11 | Discharge: 2011-12-11 | Disposition: A | Discharge status: Home / Self Care | Payer: Self-pay | Source: Home / Self Care | Attending: Family Medicine | Admitting: Family Medicine

## 2011-12-11 DIAGNOSIS — A6000 Herpesviral infection of urogenital system, unspecified: Secondary | ICD-10-CM

## 2011-12-11 DIAGNOSIS — A499 Bacterial infection, unspecified: Secondary | ICD-10-CM

## 2011-12-11 DIAGNOSIS — N760 Acute vaginitis: Secondary | ICD-10-CM

## 2011-12-11 LAB — POCT URINALYSIS DIP (DEVICE)
Glucose, UA: NEGATIVE mg/dL
Hgb urine dipstick: NEGATIVE
Nitrite: NEGATIVE
Protein, ur: NEGATIVE mg/dL
Specific Gravity, Urine: 1.015 (ref 1.005–1.030)
Urobilinogen, UA: 0.2 mg/dL (ref 0.0–1.0)
pH: 5 (ref 5.0–8.0)

## 2011-12-11 LAB — WET PREP, GENITAL
Trich, Wet Prep: NONE SEEN
Yeast Wet Prep HPF POC: NONE SEEN

## 2011-12-11 MED ORDER — METRONIDAZOLE 500 MG PO TABS: 500.0000 mg | ORAL_TABLET | Freq: Two times a day (BID) | ORAL | Status: AC

## 2011-12-11 MED ORDER — IBUPROFEN 600 MG PO TABS: 600.0000 mg | ORAL_TABLET | Freq: Three times a day (TID) | ORAL | Status: AC | PRN

## 2011-12-11 MED ORDER — VALACYCLOVIR HCL 1 G PO TABS: ORAL_TABLET | ORAL | Status: DC

## 2011-12-11 NOTE — ED Notes (Signed)
Pt  Reports  She  Was  Seen  ucc recently  For  Herpes  Outbreak  Was  rx  Valtrex  -  Just  Finished     meds -  Now she  Reports  Some  Swelling l  Inguinal  Area   She   denys  Any  Vaginal bleeding or  Any  Vaginal  Discharge      Sitting  Upright on  Exam  Table  In no  Distress  texting

## 2011-12-12 LAB — GC/CHLAMYDIA PROBE AMP, GENITAL
Chlamydia, DNA Probe: NEGATIVE
GC Probe Amp, Genital: NEGATIVE

## 2011-12-12 NOTE — ED Provider Notes (Signed)
History     CSN: [id number]  Arrival date & time 12/11/11  1605   First MD Initiated Contact with Patient 12/11/11 1605      Chief Complaint  Patient presents with  . Groin Swelling    (Consider location/radiation/quality/duration/timing/severity/associated sxs/prior treatment) HPI Comments: 31 Y/o female h/o genital herpes was recently treated for an outbrake took valtrex for 3 days. Here c/o tenderness in left inguinal area. Unsure if vaginal discharge wants to be checked for STDs. No pelvic pain.    Past Medical History  Diagnosis Date  . Herpes   . Pneumonia 2010    Past Surgical History  Procedure Date  . Dilation and curettage of uterus   . Cholecystectomy 10/03/2011    Procedure: LAPAROSCOPIC CHOLECYSTECTOMY WITH INTRAOPERATIVE CHOLANGIOGRAM;  Surgeon: Shelly Rubenstein, MD;  Location: [facility] OR;  Service: General;  Laterality: N/A;  . Tubal ligation     No family history on file.  History  Substance Use Topics  . Smoking status: Never Smoker   . Smokeless tobacco: Not on file  . Alcohol Use: Yes    OB History    Grav Para Term Preterm Abortions TAB SAB Ect Mult Living                  Review of Systems  Constitutional: Negative for fever and chills.  Genitourinary: Negative for dysuria, frequency, vaginal bleeding, genital sores, menstrual problem and pelvic pain.  Skin: Negative for rash.  Neurological: Negative for headaches.  Hematological: Positive for adenopathy.    Allergies  Review of patient's allergies indicates no known allergies.  Home Medications   Current Outpatient Rx  Name Route Sig Dispense Refill  . FLUOXETINE HCL 20 MG PO CAPS Oral Take 20 mg by mouth daily.      Marland Kitchen FLUOXETINE HCL 20 MG PO CAPS Oral Take 1 capsule (20 mg total) by mouth daily. 30 capsule 0  . IBUPROFEN 600 MG PO TABS Oral Take 1 tablet (600 mg total) by mouth every 8 (eight) hours as needed for pain. 20 tablet 0  . LORAZEPAM 0.5 MG PO TABS Oral Take 0.5 mg by  mouth every 8 (eight) hours.      Marland Kitchen METRONIDAZOLE 500 MG PO TABS Oral Take 1 tablet (500 mg total) by mouth 2 (two) times daily. 14 tablet 0  . VALACYCLOVIR HCL 1 G PO TABS  1 tab po bid for 3-5 days 10 tablet 2    BP 114/79  Pulse 98  Temp(Src) 98.6 F (37 C) (Oral)  Resp 20  SpO2 100%  LMP 12/02/2011  Physical Exam  Nursing note and vitals reviewed. Constitutional: She is oriented to person, place, and time. She appears well-developed and well-nourished. No distress.  Cardiovascular: Normal heart sounds.   Pulmonary/Chest: Breath sounds normal.  Abdominal: Soft. There is no tenderness. Hernia confirmed negative in the left inguinal area.  Genitourinary: Uterus normal. There is no rash or lesion on the right labia. There is no rash or lesion on the left labia. Cervix exhibits no motion tenderness and no discharge. Right adnexum displays no mass and no tenderness. Left adnexum displays no mass and no tenderness. Vaginal discharge found.  Lymphadenopathy:    She has no cervical adenopathy.       Left: Inguinal adenopathy present.  Neurological: She is alert and oriented to person, place, and time.  Skin: No rash noted.    ED Course  Procedures (including critical care time)  Labs Reviewed  WET PREP,  GENITAL - Abnormal; Notable for the following:    WBC, Wet Prep HPF POC FEW (*)    All other components within normal limits  GC/CHLAMYDIA PROBE AMP, GENITAL  POCT URINALYSIS DIP (DEVICE)  POCT PREGNANCY, URINE  POCT URINALYSIS DIPSTICK  POCT PREGNANCY, URINE   No results found.   1. Genital herpes   2. Bacterial vaginosis       MDM          Sharin Grave, MD 12/12/11 1242

## 2012-10-10 ENCOUNTER — Encounter (HOSPITAL_COMMUNITY): Payer: Self-pay | Admitting: Nurse Practitioner

## 2012-10-10 ENCOUNTER — Emergency Department (HOSPITAL_COMMUNITY)
Admission: EM | Admit: 2012-10-10 | Discharge: 2012-10-11 | Disposition: A | Discharge status: Home / Self Care | Payer: Self-pay | Attending: Emergency Medicine | Admitting: Emergency Medicine

## 2012-10-10 ENCOUNTER — Encounter (HOSPITAL_COMMUNITY): Payer: Self-pay | Admitting: *Deleted

## 2012-10-10 ENCOUNTER — Emergency Department (HOSPITAL_COMMUNITY)
Admission: EM | Admit: 2012-10-10 | Discharge: 2012-10-10 | Disposition: A | Discharge status: Home / Self Care | Payer: Medicaid Other | Attending: Emergency Medicine | Admitting: Emergency Medicine

## 2012-10-10 DIAGNOSIS — Z9889 Other specified postprocedural states: Secondary | ICD-10-CM | POA: Insufficient documentation

## 2012-10-10 DIAGNOSIS — Z8701 Personal history of pneumonia (recurrent): Secondary | ICD-10-CM | POA: Insufficient documentation

## 2012-10-10 DIAGNOSIS — K0889 Other specified disorders of teeth and supporting structures: Secondary | ICD-10-CM

## 2012-10-10 DIAGNOSIS — Z8619 Personal history of other infectious and parasitic diseases: Secondary | ICD-10-CM | POA: Insufficient documentation

## 2012-10-10 DIAGNOSIS — K089 Disorder of teeth and supporting structures, unspecified: Secondary | ICD-10-CM | POA: Insufficient documentation

## 2012-10-10 DIAGNOSIS — K047 Periapical abscess without sinus: Secondary | ICD-10-CM

## 2012-10-10 DIAGNOSIS — Z79899 Other long term (current) drug therapy: Secondary | ICD-10-CM | POA: Insufficient documentation

## 2012-10-10 MED ORDER — HYDROCODONE-ACETAMINOPHEN 5-500 MG PO TABS: 1.0000 | ORAL_TABLET | Freq: Four times a day (QID) | ORAL | Status: DC | PRN

## 2012-10-10 MED ORDER — PENICILLIN V POTASSIUM 500 MG PO TABS: 500.0000 mg | ORAL_TABLET | Freq: Three times a day (TID) | ORAL | Status: DC

## 2012-10-10 NOTE — ED Notes (Signed)
R middle bottom toothache

## 2012-10-10 NOTE — ED Provider Notes (Deleted)
History     CSN: [id number]  Arrival date & time 10/10/12  1050   First MD Initiated Contact with Patient 10/10/12 1102      Chief Complaint  Patient presents with  . Dental Pain    (Consider location/radiation/quality/duration/timing/severity/associated sxs/prior treatment) HPI  Past Medical History  Diagnosis Date  . Herpes   . Pneumonia 2010    Past Surgical History  Procedure Date  . Dilation and curettage of uterus   . Cholecystectomy 10/03/2011    Procedure: LAPAROSCOPIC CHOLECYSTECTOMY WITH INTRAOPERATIVE CHOLANGIOGRAM;  Surgeon: Shelly Rubenstein, MD;  Location: [facility] OR;  Service: General;  Laterality: N/A;  . Tubal ligation     History reviewed. No pertinent family history.  History  Substance Use Topics  . Smoking status: Never Smoker   . Smokeless tobacco: Not on file  . Alcohol Use: Yes    OB History    Grav Para Term Preterm Abortions TAB SAB Ect Mult Living                  Review of Systems  Allergies  Review of patient's allergies indicates no known allergies.  Home Medications   Current Outpatient Rx  Name  Route  Sig  Dispense  Refill  . BC HEADACHE POWDER PO   Oral   Take 1 packet by mouth daily as needed. For headache         . IBUPROFEN 200 MG PO TABS   Oral   Take 800 mg by mouth every 8 (eight) hours as needed. For pain         . HYDROCODONE-ACETAMINOPHEN 5-500 MG PO TABS   Oral   Take 1-2 tablets by mouth every 6 (six) hours as needed for pain.   20 tablet   0   . PENICILLIN V POTASSIUM 500 MG PO TABS   Oral   Take 1 tablet (500 mg total) by mouth 3 (three) times daily.   30 tablet   0     BP 136/94  Pulse 94  Temp 97.7 F (36.5 C) (Oral)  Resp 16  SpO2 99%  Physical Exam  ED Course  Procedures (including critical care time)  Labs Reviewed - No data to display No results found.   1. Pain, dental       MDM  Dental pain.  Will treat with antibiotics, pain meds.  Follow up with  dentist.        Geoffery Lyons, MD 10/10/12 1247  Geoffery Lyons, MD 10/20/12 2236

## 2012-10-10 NOTE — ED Notes (Signed)
Toothache for approx 2 weeks.  She was seen here earlier today for the same

## 2012-10-11 ENCOUNTER — Encounter (HOSPITAL_COMMUNITY): Payer: Self-pay | Admitting: Physical Medicine and Rehabilitation

## 2012-10-11 ENCOUNTER — Emergency Department (HOSPITAL_COMMUNITY)
Admission: EM | Admit: 2012-10-11 | Discharge: 2012-10-11 | Disposition: A | Discharge status: Home / Self Care | Payer: Self-pay | Attending: Emergency Medicine | Admitting: Emergency Medicine

## 2012-10-11 DIAGNOSIS — Z8701 Personal history of pneumonia (recurrent): Secondary | ICD-10-CM | POA: Insufficient documentation

## 2012-10-11 DIAGNOSIS — Z8619 Personal history of other infectious and parasitic diseases: Secondary | ICD-10-CM | POA: Insufficient documentation

## 2012-10-11 DIAGNOSIS — R220 Localized swelling, mass and lump, head: Secondary | ICD-10-CM | POA: Insufficient documentation

## 2012-10-11 DIAGNOSIS — R6884 Jaw pain: Secondary | ICD-10-CM | POA: Insufficient documentation

## 2012-10-11 DIAGNOSIS — K047 Periapical abscess without sinus: Secondary | ICD-10-CM | POA: Insufficient documentation

## 2012-10-11 MED ORDER — OXYCODONE-ACETAMINOPHEN 5-325 MG PO TABS: 2.0000 | ORAL_TABLET | Freq: Four times a day (QID) | ORAL | Status: DC | PRN

## 2012-10-11 MED ORDER — OXYCODONE-ACETAMINOPHEN 5-325 MG PO TABS
2.0000 | ORAL_TABLET | Freq: Once | ORAL | Status: AC
  Administered 2012-10-11: 2 via ORAL
  Filled 2012-10-11: qty 2

## 2012-10-11 NOTE — ED Notes (Signed)
Pt presents to department for evaluation of R lower molar pain. Ongoing for several days, was seen here last night for same. States no relief with pain medication. 9/10 pain upon arrival. No signs of distress noted.

## 2012-10-11 NOTE — ED Provider Notes (Signed)
Medical screening examination/treatment/procedure(s) were performed by non-physician practitioner and as supervising physician I was immediately available for consultation/collaboration.  Judah Carchi L Frederick Marro, MD 10/11/12 0830 

## 2012-10-11 NOTE — ED Notes (Signed)
Pt reports dental pain on right lower side.  Stated she was given hydrocode and has to take three at at time for the pain to go away.  States, "But when I take three I get sick.  I just want the pain to go away."

## 2012-10-11 NOTE — ED Notes (Signed)
Pt discharged home. Encouraged to follow up with dentist for further treatment.

## 2012-10-11 NOTE — ED Provider Notes (Signed)
Medical screening examination/treatment/procedure(s) were performed by non-physician practitioner and as supervising physician I was immediately available for consultation/collaboration.    Nelia Shi, MD 10/11/12 1012

## 2012-10-11 NOTE — ED Provider Notes (Signed)
History     CSN: [id number]  Arrival date & time 10/11/12  0806   First MD Initiated Contact with Patient 10/11/12 [phone]      Chief Complaint  Patient presents with  . Dental Pain    (Consider location/radiation/quality/duration/timing/severity/associated sxs/prior treatment) Patient is a 31 y.o. female presenting with tooth pain. The history is provided by the patient.  Dental PainThe primary symptoms include mouth pain. Primary symptoms do not include fever.  Additional symptoms include: jaw pain and facial swelling. Additional symptoms do not include: trouble swallowing. Associated symptoms comments: Seen last night for dental pain and mild facial swelling and returns today with complaint that pain is not managed and facial swelling is worse. He was given penicillin and reports she is taking it as prescribed. No fever. .    Past Medical History  Diagnosis Date  . Herpes   . Pneumonia 2010    Past Surgical History  Procedure Date  . Dilation and curettage of uterus   . Cholecystectomy 10/03/2011    Procedure: LAPAROSCOPIC CHOLECYSTECTOMY WITH INTRAOPERATIVE CHOLANGIOGRAM;  Surgeon: Shelly Rubenstein, MD;  Location: [facility] OR;  Service: General;  Laterality: N/A;  . Tubal ligation     No family history on file.  History  Substance Use Topics  . Smoking status: Never Smoker   . Smokeless tobacco: Not on file  . Alcohol Use: Yes    OB History    Grav Para Term Preterm Abortions TAB SAB Ect Mult Living                  Review of Systems  Constitutional: Negative for fever.  HENT: Positive for facial swelling and dental problem. Negative for trouble swallowing.   Gastrointestinal: Negative.  Negative for nausea.  Musculoskeletal: Negative.  Negative for myalgias.    Allergies  Review of patient's allergies indicates no known allergies.  Home Medications   Current Outpatient Rx  Name  Route  Sig  Dispense  Refill  . BC HEADACHE POWDER PO   Oral   Take 1  packet by mouth daily as needed. For headache         . HYDROCODONE-ACETAMINOPHEN 5-500 MG PO TABS   Oral   Take 1-2 tablets by mouth every 6 (six) hours as needed for pain.   20 tablet   0   . IBUPROFEN 200 MG PO TABS   Oral   Take 800 mg by mouth every 8 (eight) hours as needed. For pain         . PENICILLIN V POTASSIUM 500 MG PO TABS   Oral   Take 1 tablet (500 mg total) by mouth 3 (three) times daily.   30 tablet   0     BP 141/96  Pulse 92  Temp 98.3 F (36.8 C) (Oral)  Resp 18  SpO2 99%  Physical Exam  Constitutional: She is oriented to person, place, and time. She appears well-developed and well-nourished.  HENT:       Essentially good dentition with marked swelling adjacent to lower right canine tooth. No pointing abscess that is visualized.   Neck: Normal range of motion.  Pulmonary/Chest: Effort normal.  Neurological: She is alert and oriented to person, place, and time.  Skin: Skin is warm and dry.    ED Course  Procedures (including critical care time)  Labs Reviewed - No data to display No results found.   No diagnosis found.  1. Dental abscess  MDM  Pain medication changed.  Encouraged continuation of penicillin Rx, warm compresses and dental follow up.        Rodena Medin, PA-C 10/11/12 [phone]

## 2012-10-11 NOTE — ED Notes (Signed)
Kathline Magic, PA went over discharge instructions with patient.  Attempted to locate pt in lobby and restrooms.

## 2012-10-11 NOTE — ED Provider Notes (Signed)
History     CSN: [id number]  Arrival date & time 10/10/12  2333   First MD Initiated Contact with Patient 10/11/12 0005      Chief Complaint  Patient presents with  . Dental Pain   HPI  History provided by the patient. Patient is a 31 year old female who returns with complaints of persistent and worsened right lower dental pain. Patient was seen earlier today for similar complaints. She was given prescriptions for hydrocodone and penicillin with dental referral. Patient states she took doses of pain medication and antibiotics approximately 2 hours ago without any change of pain. Pain has been present for the past 2 weeks but worsened over the past few days. Patient states she has a hole in her tooth. She also reports some swelling of the right jaw and face. Denies any fever, chills or sweats. Denies any bleeding or drainage from the tooth or mouth. Denies any difficulties swallowing or breathing.    Past Medical History  Diagnosis Date  . Herpes   . Pneumonia 2010    Past Surgical History  Procedure Date  . Dilation and curettage of uterus   . Cholecystectomy 10/03/2011    Procedure: LAPAROSCOPIC CHOLECYSTECTOMY WITH INTRAOPERATIVE CHOLANGIOGRAM;  Surgeon: Shelly Rubenstein, MD;  Location: [facility] OR;  Service: General;  Laterality: N/A;  . Tubal ligation     No family history on file.  History  Substance Use Topics  . Smoking status: Never Smoker   . Smokeless tobacco: Not on file  . Alcohol Use: Yes    OB History    Grav Para Term Preterm Abortions TAB SAB Ect Mult Living                  Review of Systems  Constitutional: Negative for fever and chills.  HENT: Positive for dental problem. Negative for drooling and trouble swallowing.   Gastrointestinal: Negative for vomiting.  All other systems reviewed and are negative.    Allergies  Review of patient's allergies indicates no known allergies.  Home Medications   Current Outpatient Rx  Name  Route  Sig   Dispense  Refill  . BC HEADACHE POWDER PO   Oral   Take 1 packet by mouth daily as needed. For headache         . HYDROCODONE-ACETAMINOPHEN 5-500 MG PO TABS   Oral   Take 1-2 tablets by mouth every 6 (six) hours as needed for pain.   20 tablet   0   . IBUPROFEN 200 MG PO TABS   Oral   Take 800 mg by mouth every 8 (eight) hours as needed. For pain         . PENICILLIN V POTASSIUM 500 MG PO TABS   Oral   Take 1 tablet (500 mg total) by mouth 3 (three) times daily.   30 tablet   0     BP 135/84  Pulse 90  Temp 98.3 F (36.8 C) (Oral)  Resp 22  SpO2 99%  Physical Exam  Nursing note and vitals reviewed. Constitutional: She is oriented to person, place, and time. She appears well-developed and well-nourished. She appears distressed.       Patient tearful  HENT:  Head: Normocephalic.  Mouth/Throat:         There is partial fracture and decay of the posterior aspect of the right lower first premolar. Tenderness to percussion over this area. No significant swelling of the adjacent gum. No swelling under the tongue.  Neck: Normal  range of motion. Neck supple.  Cardiovascular: Normal rate and regular rhythm.   Pulmonary/Chest: Effort normal and breath sounds normal.  Lymphadenopathy:    She has no cervical adenopathy.  Neurological: She is alert and oriented to person, place, and time.  Skin: Skin is warm and dry.  Psychiatric: She has a normal mood and affect. Her behavior is normal.    ED Course  Procedures   Dental Block Performed by: Angus Seller Authorized by: Angus Seller Consent: Verbal consent obtained. Risks and benefits: risks, benefits and alternatives were discussed Consent given by: patient Patient identity confirmed: provided demographic data  Location: Right lower first premolar  Local anesthetic: Bupivacaine 0.5% with epinephrine  Anesthetic total: 1.8 ml  Irrigation method: syringe  Patient tolerance: Patient tolerated the procedure  well with no immediate complications. Pain improved.     1. Pain, dental   2. Periapical abscess       MDM  12:35 AM patient seen and evaluated. Patient appears uncomfortable.        Angus Seller, [place] 10/11/12 [phone]

## 2012-10-23 NOTE — ED Provider Notes (Signed)
History     CSN: [id number]  Arrival date & time 10/10/12  1050   First MD Initiated Contact with Patient 10/10/12 1102      Chief Complaint  Patient presents with  . Dental Pain    (Consider location/radiation/quality/duration/timing/severity/associated sxs/prior treatment) HPI Comments: Dental pain for several days.  No fever or chills.  No injury or trauma.  No facial swelling swelling.    The history is provided by the patient.    Past Medical History  Diagnosis Date  . Herpes   . Pneumonia 2010    Past Surgical History  Procedure Date  . Dilation and curettage of uterus   . Cholecystectomy 10/03/2011    Procedure: LAPAROSCOPIC CHOLECYSTECTOMY WITH INTRAOPERATIVE CHOLANGIOGRAM;  Surgeon: Shelly Rubenstein, MD;  Location: [facility] OR;  Service: General;  Laterality: N/A;  . Tubal ligation     History reviewed. No pertinent family history.  History  Substance Use Topics  . Smoking status: Never Smoker   . Smokeless tobacco: Not on file  . Alcohol Use: Yes    OB History    Grav Para Term Preterm Abortions TAB SAB Ect Mult Living                  Review of Systems  All other systems reviewed and are negative.    Allergies  Review of patient's allergies indicates no known allergies.  Home Medications   Current Outpatient Rx  Name  Route  Sig  Dispense  Refill  . BC HEADACHE POWDER PO   Oral   Take 1 packet by mouth daily as needed. For headache         . IBUPROFEN 200 MG PO TABS   Oral   Take 800 mg by mouth every 8 (eight) hours as needed. For pain         . HYDROCODONE-ACETAMINOPHEN 5-500 MG PO TABS   Oral   Take 1-2 tablets by mouth every 6 (six) hours as needed for pain.   20 tablet   0   . OXYCODONE-ACETAMINOPHEN 5-325 MG PO TABS   Oral   Take 2 tablets by mouth every 6 (six) hours as needed for pain.   20 tablet   0   . PENICILLIN V POTASSIUM 500 MG PO TABS   Oral   Take 1 tablet (500 mg total) by mouth 3 (three) times daily.  30 tablet   0     BP 136/94  Pulse 94  Temp 97.7 F (36.5 C) (Oral)  Resp 16  SpO2 99%  Physical Exam  Nursing note and vitals reviewed. Constitutional: She is oriented to person, place, and time. She appears well-developed and well-nourished. No distress.  HENT:  Head: Normocephalic and atraumatic.       There are decayed molars presents with mild gingival irritation.  Neck: Normal range of motion. Neck supple.  Lymphadenopathy:    She has no cervical adenopathy.  Neurological: She is alert and oriented to person, place, and time.  Skin: Skin is warm and dry. She is not diaphoretic.    ED Course  Procedures (including critical care time)  Labs Reviewed - No data to display No results found.   1. Pain, dental       MDM  Will treat with antibiotics, pain meds.  Return prn.        Geoffery Lyons, MD 10/23/12 [phone]

## 2014-02-07 ENCOUNTER — Encounter: Payer: Self-pay | Admitting: Obstetrics & Gynecology

## 2014-02-07 ENCOUNTER — Ambulatory Visit (INDEPENDENT_AMBULATORY_CARE_PROVIDER_SITE_OTHER): Payer: PRIVATE HEALTH INSURANCE | Admitting: Obstetrics & Gynecology

## 2014-02-07 VITALS — BP 115/83 | HR 99 | Temp 98.5°F | Ht 71.0 in | Wt 226.0 lb

## 2014-02-07 DIAGNOSIS — N943 Premenstrual tension syndrome: Secondary | ICD-10-CM

## 2014-02-07 MED ORDER — ESCITALOPRAM OXALATE 10 MG PO TABS: 10.0000 mg | ORAL_TABLET | Freq: Every day | ORAL | Status: DC

## 2014-02-07 NOTE — Progress Notes (Signed)
Subjective:     Patricia Jenkins is a 32 y.o. female here for a routine exam.  Current complaints:Paitent is in the office today for a problem visit. Patient states she is having a problem with her hormones. Patient states 2 weeks before her cycle she is a different person. Patient states she is very irritated. Patient states it is affecting her relationship with her kids, boyfriend and her job. Patient states she just feels really bad. Patient states she was diagnosed with low iron, thyroid and progesterone. Patient states she is really tired a lot. Patient states she would like to have a pregnancy test done today.  Personal health questionnaire reviewed: yes.   Gynecologic History 01/29/2013 Contraception: tubal ligation Last Pap: 2014. Results were: abnormal ([facility] [place])   Obstetric History OB History  No data available     The following portions of the patient's history were reviewed and updated as appropriate: allergies, current medications, past family history, past medical history, past social history, past surgical history and problem list.  Review of Systems Pertinent items are noted in HPI.    Objective:     No exam.     Assessment:   PMS--PMDD  Plan:    SSRI Consider COCP Keep symptom diary Return in a few months

## 2014-02-09 NOTE — Patient Instructions (Signed)
Premenstrual Syndrome Premenstrual syndrome (PMS) is a condition that consists of physical, emotional, and behavioral symptoms that affect women of childbearing age. PMS occurs 5 14 days before the start of a menstrual period and often recurs in a predictable pattern. The symptoms go away a few days after the menstrual period starts. PMS can interfere in many ways with normal daily activities and can range from mild to severe. When PMS is considered severe, it may be diagnosed as premenstrual dysphoric disorder (PMDD). A small percentage of women are affected by PMS symptoms and an even smaller percentage of those women are affected by PMDD.  CAUSES  The exact cause of PMS is unknown, but it seems to be related to cyclic hormone changes that happen before menstruation. These hormones are thought to affect chemicals in the brain (serotonin) that can influence a person's mood.  SYMPTOMS  Symptoms of PMS recur consistently from month to month and go away completely after the menstrual period starts. The most common emotional or behavioral symptom is mood swings. These mood swings can be disabling and interfere with normal activities of daily living. Other common symptoms include depression and angry outbursts. Other symptoms may include:   Irritability.  Anxiety.  Crying spells.   Food cravings or appetite changes.   Changes in sexual desire.   Confusion.   Aggression.   Social withdrawal.   Poor concentration. The most common physical symptoms include a sense of bloating, breast pain, headaches, and extreme fatigue. Other physical symptoms include:   Backaches.   Swelling of the hands and feet.   Weight gain.   Hot flashes.  DIAGNOSIS  To make a diagnosis, your caregiver will ask questions to confirm that you are having a pattern of symptoms. Symptoms must:   Be present 5 days before the start of your period and be present at least 3 months in a row.   End within 4 days  after your period starts.   Interfere with some of your normal activities.  Other conditions, such as thyroid disease, depression, and migraine headaches must be ruled out before a diagnosis of PMS is confirmed.  TREATMENT  Your caregiver may suggest ways to maintain a healthy lifestyle, such as exercise. Over-the-counter pain relievers may ease cramps, aches, pains, headaches, and breast tenderness. However, selective serotonin reuptake inhibitors (SSRIs) are medicines that are most beneficial in improving PMS if taken in the second half of the monthly cycle. They may be taken on a daily basis. The most effective oral contraceptive pill used for symptoms of PMS is one that contains the ingredient drospirenone. Taking 4 days off of the pill instead of the usual 7 days also has shown to increase effectiveness.  There are a number of drugs, dietary supplements, vitamins, and water pills (diuretics) which have been suggested to be helpful but have not shown to be of any benefit to improving PMS symptoms.  HOME CARE INSTRUCTIONS   For 2 3 months, write down your symptoms, their severity, and how long they last. This may help your caregiver prescribe the best treatment for your symptoms.  Exercise regularly as suggested by your caregiver.  Eat a regular, well-balanced diet.  Avoid caffeine, alcohol, and tobacco consumption.  Limit salt and salty foods to lessen bloating and fluid retention.  Get enough sleep. Practice relaxation techniques.  Drink enough fluids to keep your urine clear or pale yellow.  Take medicines as directed by your caregiver.  Limit stress.  Take a multivitamin as directed  by your caregiver. Document Released: 11/01/2000 Document Revised: 07/29/2012 Document Reviewed: 03/23/2012 [facility] Patient Information 2014 [place].

## 2014-02-23 ENCOUNTER — Encounter: Payer: Self-pay | Admitting: Obstetrics & Gynecology

## 2014-05-12 ENCOUNTER — Ambulatory Visit: Payer: PRIVATE HEALTH INSURANCE | Admitting: Obstetrics & Gynecology

## 2014-05-16 ENCOUNTER — Ambulatory Visit: Payer: PRIVATE HEALTH INSURANCE | Admitting: Obstetrics & Gynecology

## 2014-05-19 ENCOUNTER — Telehealth: Payer: Self-pay | Admitting: *Deleted

## 2014-05-19 DIAGNOSIS — F329 Major depressive disorder, single episode, unspecified: Secondary | ICD-10-CM

## 2014-05-19 DIAGNOSIS — F32A Depression, unspecified: Secondary | ICD-10-CM

## 2014-05-19 MED ORDER — ESCITALOPRAM OXALATE 10 MG PO TABS: 10.0000 mg | ORAL_TABLET | Freq: Every day | ORAL | Status: DC

## 2014-05-19 NOTE — Telephone Encounter (Signed)
Patient is requesting a refill on her Lexapro. 5:07 call to patient - left message will refill 1 time but must call the office for appointment for follow up.  ( Patient in office in March- with 2 refills- has not had follow up as recommended- canceled and no showed)

## 2014-06-01 ENCOUNTER — Emergency Department (HOSPITAL_COMMUNITY)
Admission: EM | Admit: 2014-06-01 | Discharge: 2014-06-02 | Disposition: A | Discharge status: Home / Self Care | Payer: Medicaid Other | Attending: Emergency Medicine | Admitting: Emergency Medicine

## 2014-06-01 ENCOUNTER — Encounter (HOSPITAL_COMMUNITY): Payer: Self-pay | Admitting: Emergency Medicine

## 2014-06-01 DIAGNOSIS — R110 Nausea: Secondary | ICD-10-CM | POA: Insufficient documentation

## 2014-06-01 DIAGNOSIS — N898 Other specified noninflammatory disorders of vagina: Secondary | ICD-10-CM | POA: Insufficient documentation

## 2014-06-01 DIAGNOSIS — Z8639 Personal history of other endocrine, nutritional and metabolic disease: Secondary | ICD-10-CM | POA: Insufficient documentation

## 2014-06-01 DIAGNOSIS — Z3202 Encounter for pregnancy test, result negative: Secondary | ICD-10-CM | POA: Insufficient documentation

## 2014-06-01 DIAGNOSIS — Z8619 Personal history of other infectious and parasitic diseases: Secondary | ICD-10-CM | POA: Diagnosis not present

## 2014-06-01 DIAGNOSIS — Z79899 Other long term (current) drug therapy: Secondary | ICD-10-CM | POA: Diagnosis not present

## 2014-06-01 DIAGNOSIS — Z87891 Personal history of nicotine dependence: Secondary | ICD-10-CM | POA: Insufficient documentation

## 2014-06-01 DIAGNOSIS — R109 Unspecified abdominal pain: Secondary | ICD-10-CM | POA: Insufficient documentation

## 2014-06-01 DIAGNOSIS — K5900 Constipation, unspecified: Secondary | ICD-10-CM | POA: Insufficient documentation

## 2014-06-01 DIAGNOSIS — Z862 Personal history of diseases of the blood and blood-forming organs and certain disorders involving the immune mechanism: Secondary | ICD-10-CM | POA: Insufficient documentation

## 2014-06-01 DIAGNOSIS — Z9089 Acquired absence of other organs: Secondary | ICD-10-CM | POA: Insufficient documentation

## 2014-06-01 DIAGNOSIS — Z9851 Tubal ligation status: Secondary | ICD-10-CM | POA: Diagnosis not present

## 2014-06-01 DIAGNOSIS — Z8701 Personal history of pneumonia (recurrent): Secondary | ICD-10-CM | POA: Insufficient documentation

## 2014-06-01 LAB — COMPREHENSIVE METABOLIC PANEL
ALBUMIN: 3.8 g/dL (ref 3.5–5.2)
ALK PHOS: 57 U/L (ref 39–117)
ALT: 17 U/L (ref 0–35)
ANION GAP: 14 (ref 5–15)
AST: 23 U/L (ref 0–37)
BILIRUBIN TOTAL: 0.3 mg/dL (ref 0.3–1.2)
BUN: 7 mg/dL (ref 6–23)
CHLORIDE: 101 meq/L (ref 96–112)
CO2: 25 mEq/L (ref 19–32)
Calcium: 9.4 mg/dL (ref 8.4–10.5)
Creatinine, Ser: 0.69 mg/dL (ref 0.50–1.10)
GFR calc Af Amer: >90 mL/min (ref >90)
GFR calc non Af Amer: >90 mL/min (ref >90)
Glucose, Bld: 110 mg/dL — ABNORMAL HIGH (ref 70–99)
POTASSIUM: 3.4 meq/L — AB (ref 3.7–5.3)
Sodium: 140 mEq/L (ref 137–147)
Total Protein: 7.5 g/dL (ref 6.0–8.3)

## 2014-06-01 LAB — URINALYSIS, ROUTINE W REFLEX MICROSCOPIC
Bilirubin Urine: NEGATIVE
GLUCOSE, UA: NEGATIVE mg/dL
HGB URINE DIPSTICK: NEGATIVE
Ketones, ur: NEGATIVE mg/dL
Leukocytes, UA: NEGATIVE
Nitrite: NEGATIVE
PH: 8 (ref 5.0–8.0)
PROTEIN: NEGATIVE mg/dL
SPECIFIC GRAVITY, URINE: 1.011 (ref 1.005–1.030)
Urobilinogen, UA: 0.2 mg/dL (ref 0.0–1.0)

## 2014-06-01 LAB — CBC WITH DIFFERENTIAL/PLATELET
BASOS PCT: 1 % (ref 0–1)
Basophils Absolute: 0 10*3/uL (ref 0.0–0.1)
Eosinophils Absolute: 0.1 10*3/uL (ref 0.0–0.7)
Eosinophils Relative: 1 % (ref 0–5)
HCT: 33.9 % — ABNORMAL LOW (ref 36.0–46.0)
HEMOGLOBIN: 11.5 g/dL — AB (ref 12.0–15.0)
LYMPHS ABS: 2 10*3/uL (ref 0.7–4.0)
Lymphocytes Relative: 28 % (ref 12–46)
MCH: 27.8 pg (ref 26.0–34.0)
MCHC: 33.9 g/dL (ref 30.0–36.0)
MCV: 82.1 fL (ref 78.0–100.0)
MONOS PCT: 10 % (ref 3–12)
Monocytes Absolute: 0.7 10*3/uL (ref 0.1–1.0)
NEUTROS ABS: 4.2 10*3/uL (ref 1.7–7.7)
NEUTROS PCT: 60 % (ref 43–77)
Platelets: 251 10*3/uL (ref 150–400)
RBC: 4.13 MIL/uL (ref 3.87–5.11)
RDW: 13.9 % (ref 11.5–15.5)
WBC: 6.9 10*3/uL (ref 4.0–10.5)

## 2014-06-01 LAB — LIPASE, BLOOD: Lipase: 34 U/L (ref 11–59)

## 2014-06-01 LAB — POC URINE PREG, ED: Preg Test, Ur: NEGATIVE

## 2014-06-01 NOTE — ED Notes (Addendum)
C/o generalized abd pain, nausea, and bloated feeling x 3 weeks.  States she has taken negative home pregnancy test and a negative test at dr's office yesterday.  States she feels like abd is stretching. No urinary complaints.

## 2014-06-02 ENCOUNTER — Encounter (HOSPITAL_COMMUNITY): Payer: Self-pay | Admitting: Radiology

## 2014-06-02 ENCOUNTER — Emergency Department (HOSPITAL_COMMUNITY): Payer: Medicaid Other

## 2014-06-02 LAB — WET PREP, GENITAL
TRICH WET PREP: NONE SEEN
YEAST WET PREP: NONE SEEN

## 2014-06-02 LAB — GC/CHLAMYDIA PROBE AMP
CT PROBE, AMP APTIMA: NEGATIVE
GC Probe RNA: NEGATIVE

## 2014-06-02 MED ORDER — IOHEXOL 300 MG/ML  SOLN
25.0000 mL | Freq: Once | INTRAMUSCULAR | Status: AC | PRN
  Administered 2014-06-02: 25 mL via ORAL

## 2014-06-02 MED ORDER — POLYETHYLENE GLYCOL 3350 17 G PO PACK: 17.0000 g | PACK | Freq: Every day | ORAL | Status: DC

## 2014-06-02 MED ORDER — IOHEXOL 300 MG/ML  SOLN
80.0000 mL | Freq: Once | INTRAMUSCULAR | Status: AC | PRN
  Administered 2014-06-02: 80 mL via INTRAVENOUS

## 2014-06-02 NOTE — Discharge Instructions (Signed)

## 2014-06-02 NOTE — ED Provider Notes (Signed)
CSN: [id number]     Arrival date & time 06/01/14  2029 History   First MD Initiated Contact with Patient 06/02/14 0018     Chief Complaint  Patient presents with  . Abdominal Pain     (Consider location/radiation/quality/duration/timing/severity/associated sxs/prior Treatment) HPI Comments: PT presents with abd pain and bloating.  Reports a 3-4 day history of worsening abdominal bloating. She's had some nausea but no vomiting. She had some tenderness across her lower abdomen. She does have a slight increase her vaginal discharge. She denies any diarrhea or constipation. She denies any urinary symptoms. She denies any fevers or chills. She's taken pregnancy tests at home that was negative.  Patient is a 33 y.o. female presenting with abdominal pain.  Abdominal Pain Associated symptoms: nausea   Associated symptoms: no chest pain, no chills, no cough, no diarrhea, no fatigue, no fever, no hematuria, no shortness of breath and no vomiting     Past Medical History  Diagnosis Date  . Herpes   . Pneumonia 2010  . Thyroid disease    Past Surgical History  Procedure Laterality Date  . Dilation and curettage of uterus    . Tubal ligation    . Cholecystectomy  10/03/2011    Procedure: LAPAROSCOPIC CHOLECYSTECTOMY WITH INTRAOPERATIVE CHOLANGIOGRAM;  Surgeon: Harl Bowie, MD;  Location: [facility];  Service: General;  Laterality: N/A;   No family history on file. History  Substance Use Topics  . Smoking status: Former Research scientist (life sciences)  . Smokeless tobacco: Never Used  . Alcohol Use: Yes   OB History   Grav Para Term Preterm Abortions TAB SAB Ect Mult Living   4 3 3  1  1   3      Review of Systems  Constitutional: Negative for fever, chills, diaphoresis and fatigue.  HENT: Negative for congestion, rhinorrhea and sneezing.   Eyes: Negative.   Respiratory: Negative for cough, chest tightness and shortness of breath.   Cardiovascular: Negative for chest pain and leg swelling.   Gastrointestinal: Positive for nausea, abdominal pain and abdominal distention. Negative for vomiting, diarrhea and blood in stool.  Genitourinary: Negative for frequency, hematuria, flank pain and difficulty urinating.  Musculoskeletal: Negative for arthralgias and back pain.  Skin: Negative for rash.  Neurological: Negative for dizziness, speech difficulty, weakness, numbness and headaches.      Allergies  Review of patient's allergies indicates no known allergies.  Home Medications   Prior to Admission medications   Medication Sig Start Date End Date Taking? Authorizing Provider  escitalopram (LEXAPRO) 10 MG tablet Take 10 mg by mouth daily.   Yes Historical Provider, MD  ibuprofen (ADVIL,MOTRIN) 200 MG tablet Take 800 mg by mouth every 8 (eight) hours as needed. For pain   Yes Historical Provider, MD  valACYclovir (VALTREX) 1000 MG tablet Take 1,000 mg by mouth 2 (two) times daily.   Yes Historical Provider, MD  Aspirin-Salicylamide-Caffeine (BC HEADACHE POWDER PO) Take 1 packet by mouth daily as needed. For headache    Historical Provider, MD  polyethylene glycol (MIRALAX / GLYCOLAX) packet Take 17 g by mouth daily. 06/02/14   Malvin Johns, MD   BP 114/77  Pulse 91  Temp(Src) 97.9 F (36.6 C) (Oral)  Resp 20  Ht 5\' 11"  (1.803 m)  Wt 238 lb (107.956 kg)  BMI 33.21 kg/m2  SpO2 100%  LMP 05/20/2014 Physical Exam  Constitutional: She is oriented to person, place, and time. She appears well-developed and well-nourished.  HENT:  Head: Normocephalic and atraumatic.  Mouth/Throat: Oropharynx is clear and moist.  Eyes: Pupils are equal, round, and reactive to light.  Neck: Normal range of motion. Neck supple.  Cardiovascular: Normal rate, regular rhythm and normal heart sounds.   Pulmonary/Chest: Effort normal and breath sounds normal. No respiratory distress. She has no wheezes. She has no rales. She exhibits no tenderness.  Abdominal: Soft. Bowel sounds are normal. There is  tenderness (moderate TTP across lower abd). There is no rebound and no guarding.  Genitourinary: Vaginal discharge (thin, white) found.  No CMT  Musculoskeletal: Normal range of motion. She exhibits no edema.  Lymphadenopathy:    She has no cervical adenopathy.  Neurological: She is alert and oriented to person, place, and time.  Skin: Skin is warm and dry. No rash noted.  Psychiatric: She has a normal mood and affect.    ED Course  Procedures (including critical care time) Labs Review Results for orders placed during the hospital encounter of 06/01/14  WET PREP, GENITAL      Result Value Ref Range   Yeast Wet Prep HPF POC NONE SEEN  NONE SEEN   Trich, Wet Prep NONE SEEN  NONE SEEN   Clue Cells Wet Prep HPF POC FEW (*) NONE SEEN   WBC, Wet Prep HPF POC FEW (*) NONE SEEN  CBC WITH DIFFERENTIAL      Result Value Ref Range   WBC 6.9  4.0 - 10.5 K/uL   RBC 4.13  3.87 - 5.11 MIL/uL   Hemoglobin 11.5 (*) 12.0 - 15.0 g/dL   HCT 33.9 (*) 36.0 - 46.0 %   MCV 82.1  78.0 - 100.0 fL   MCH 27.8  26.0 - 34.0 pg   MCHC 33.9  30.0 - 36.0 g/dL   RDW 13.9  11.5 - 15.5 %   Platelets 251  150 - 400 K/uL   Neutrophils Relative % 60  43 - 77 %   Neutro Abs 4.2  1.7 - 7.7 K/uL   Lymphocytes Relative 28  12 - 46 %   Lymphs Abs 2.0  0.7 - 4.0 K/uL   Monocytes Relative 10  3 - 12 %   Monocytes Absolute 0.7  0.1 - 1.0 K/uL   Eosinophils Relative 1  0 - 5 %   Eosinophils Absolute 0.1  0.0 - 0.7 K/uL   Basophils Relative 1  0 - 1 %   Basophils Absolute 0.0  0.0 - 0.1 K/uL  COMPREHENSIVE METABOLIC PANEL      Result Value Ref Range   Sodium 140  137 - 147 mEq/L   Potassium 3.4 (*) 3.7 - 5.3 mEq/L   Chloride 101  96 - 112 mEq/L   CO2 25  19 - 32 mEq/L   Glucose, Bld 110 (*) 70 - 99 mg/dL   BUN 7  6 - 23 mg/dL   Creatinine, Ser 0.69  0.50 - 1.10 mg/dL   Calcium 9.4  8.4 - 10.5 mg/dL   Total Protein 7.5  6.0 - 8.3 g/dL   Albumin 3.8  3.5 - 5.2 g/dL   AST 23  0 - 37 U/L   ALT 17  0 - 35 U/L    Alkaline Phosphatase 57  39 - 117 U/L   Total Bilirubin 0.3  0.3 - 1.2 mg/dL   GFR calc non Af Amer >90  >90 mL/min   GFR calc Af Amer >90  >90 mL/min   Anion gap 14  5 - 15  LIPASE, BLOOD  Result Value Ref Range   Lipase 34  11 - 59 U/L  URINALYSIS, ROUTINE W REFLEX MICROSCOPIC      Result Value Ref Range   Color, Urine YELLOW  YELLOW   APPearance CLOUDY (*) CLEAR   Specific Gravity, Urine 1.011  1.005 - 1.030   pH 8.0  5.0 - 8.0   Glucose, UA NEGATIVE  NEGATIVE mg/dL   Hgb urine dipstick NEGATIVE  NEGATIVE   Bilirubin Urine NEGATIVE  NEGATIVE   Ketones, ur NEGATIVE  NEGATIVE mg/dL   Protein, ur NEGATIVE  NEGATIVE mg/dL   Urobilinogen, UA 0.2  0.0 - 1.0 mg/dL   Nitrite NEGATIVE  NEGATIVE   Leukocytes, UA NEGATIVE  NEGATIVE  POC URINE PREG, ED      Result Value Ref Range   Preg Test, Ur NEGATIVE  NEGATIVE   Dg Abd Acute W/chest  06/02/2014   CLINICAL DATA:  Abdominal pain with nausea  EXAM: ACUTE ABDOMEN SERIES (ABDOMEN 2 VIEW & CHEST 1 VIEW)  COMPARISON:  Chest radiograph November 01, 2008 and abdomen radiograph May 21, 2005  FINDINGS: PA chest: Lungs are clear. The heart size and pulmonary vascularity are normal. No adenopathy.  Supine and upright abdomen: There is no appreciable bowel dilatation. There are scattered air-fluid levels throughout the abdomen, however. No free air. There are surgical clips in the gallbladder fossa region as well as in the pelvis.  IMPRESSION: Scattered air-fluid levels. Suspect early ileus or enteritis. A degree of obstruction is possible but felt to be less likely. No free air. Lungs clear.   Electronically Signed   By: Lowella Grip M.D.   On: 06/02/2014 02:17      Imaging Review Ct Abdomen Pelvis W Contrast  06/02/2014   CLINICAL DATA:  Generalized abdominal pain.  EXAM: CT ABDOMEN AND PELVIS WITH CONTRAST  TECHNIQUE: Multidetector CT imaging of the abdomen and pelvis was performed using the standard protocol following bolus administration  of intravenous contrast.  CONTRAST:  39mL OMNIPAQUE IOHEXOL 300 MG/ML  SOLN  COMPARISON:  Acute abdominal series June 02, 2014  FINDINGS: LUNG BASES: Included view of the lung bases are clear. Visualized heart and pericardium are unremarkable.  SOLID ORGANS: The liver, spleen, pancreas and adrenal glands are unremarkable. Status post cholecystectomy.  GASTROINTESTINAL TRACT: The stomach, small and large bowel are normal in course and caliber without inflammatory changes. Enteric contrast has not yet reached the distal small bowel. Moderate amount of retained large bowel stool. Normal appendix.  KIDNEYS/ URINARY TRACT: Kidneys are orthotopic, demonstrating symmetric enhancement. No nephrolithiasis, hydronephrosis or solid renal masses. Too small to characterize hypodensity right kidney. 15 mm right interpolar cyst, scarring or upper pole left kidney, 11 mm right lower pole cyst. The unopacified ureters are normal in course and caliber. Urinary bladder is partially distended and unremarkable.  PERITONEUM/RETROPERITONEUM: Granuloma left anterior abdomen No intraperitoneal free fluid nor free air. Aortoiliac vessels are normal in course and caliber. No lymphadenopathy by CT size criteria. Internal reproductive organs are normal appearance, tubal ligation clips present.  SOFT TISSUE/OSSEOUS STRUCTURES: Nonsuspicious. Mild diastases of the rectus abdominis muscles.  IMPRESSION: Moderate amount of retained large bowel stool without obstruction nor acute intra-abdominal/pelvic process.   Electronically Signed   By: Elon Alas   On: 06/02/2014 04:52   Dg Abd Acute W/chest  06/02/2014   CLINICAL DATA:  Abdominal pain with nausea  EXAM: ACUTE ABDOMEN SERIES (ABDOMEN 2 VIEW & CHEST 1 VIEW)  COMPARISON:  Chest radiograph November 01, 2008 and  abdomen radiograph May 21, 2005  FINDINGS: PA chest: Lungs are clear. The heart size and pulmonary vascularity are normal. No adenopathy.  Supine and upright abdomen: There is no  appreciable bowel dilatation. There are scattered air-fluid levels throughout the abdomen, however. No free air. There are surgical clips in the gallbladder fossa region as well as in the pelvis.  IMPRESSION: Scattered air-fluid levels. Suspect early ileus or enteritis. A degree of obstruction is possible but felt to be less likely. No free air. Lungs clear.   Electronically Signed   By: Lowella Grip M.D.   On: 06/02/2014 02:17     EKG Interpretation None      MDM   Final diagnoses:  Abdominal pain, unspecified abdominal location  Constipation, unspecified constipation type    Patient has no evidence of obstruction. She does have a large amount stool on CT scan. She was discharged home in good condition. She was given a prescription for MiraLAX. She was advised to followup with her primary care physician or return here as needed for any worsening symptoms    Malvin Johns, MD 06/02/14 [id number]

## 2014-06-02 NOTE — ED Notes (Signed)
Pelvic cart set up at bedside  

## 2014-06-27 ENCOUNTER — Ambulatory Visit: Payer: Medicaid Other | Attending: Internal Medicine | Admitting: Internal Medicine

## 2014-06-27 ENCOUNTER — Encounter: Payer: Self-pay | Admitting: Internal Medicine

## 2014-06-27 VITALS — BP 115/82 | HR 94 | Temp 98.7°F | Resp 14 | Ht 71.0 in | Wt 234.0 lb

## 2014-06-27 DIAGNOSIS — B009 Herpesviral infection, unspecified: Secondary | ICD-10-CM | POA: Diagnosis not present

## 2014-06-27 DIAGNOSIS — E039 Hypothyroidism, unspecified: Secondary | ICD-10-CM | POA: Diagnosis present

## 2014-06-27 DIAGNOSIS — Z23 Encounter for immunization: Secondary | ICD-10-CM | POA: Diagnosis not present

## 2014-06-27 DIAGNOSIS — Z833 Family history of diabetes mellitus: Secondary | ICD-10-CM

## 2014-06-27 LAB — LIPID PANEL
Cholesterol: 177 mg/dL (ref 0–200)
HDL: 47 mg/dL (ref >39)
LDL Cholesterol: 102 mg/dL — ABNORMAL HIGH (ref 0–99)
Total CHOL/HDL Ratio: 3.8 Ratio
Triglycerides: 142 mg/dL (ref <150)
VLDL: 28 mg/dL (ref 0–40)

## 2014-06-27 LAB — T4, FREE: Free T4: 0.9 ng/dL (ref 0.80–1.80)

## 2014-06-27 LAB — HEMOGLOBIN A1C
Hgb A1c MFr Bld: 5.7 % — ABNORMAL HIGH (ref <5.7)
Mean Plasma Glucose: 117 mg/dL — ABNORMAL HIGH (ref <117)

## 2014-06-27 LAB — TSH: TSH: 0.77 u[IU]/mL (ref 0.350–4.500)

## 2014-06-27 MED ORDER — VALACYCLOVIR HCL 1 G PO TABS: 1000.0000 mg | ORAL_TABLET | Freq: Every day | ORAL | Status: DC

## 2014-06-27 MED ORDER — ESCITALOPRAM OXALATE 10 MG PO TABS: 10.0000 mg | ORAL_TABLET | Freq: Every day | ORAL | Status: DC

## 2014-06-27 NOTE — Progress Notes (Signed)
Patient ID: Patricia Jenkins, female   DOB: July 27, 1981, 33 y.o.   MRN: [id number]  MWU:[id number]  [id number]:[id number]  DOB - [birth year]  CC:  Chief Complaint  Patient presents with  . Establish Care       HPI: Patricia Jenkins is a 33 y.o. female here today to establish medical care.  Patient reports that she has a history of hypothyroidism and has been off all her medications for over one year due to cost.  Patient reports that she has been having a great deal of weight gain since being off medications.  She reports that she was given Lexapro 4 months ago due to mood swings during her menstrual cycle and it has been working well to even her out.  She would like refills of her medications.  Patient reports that she is a social drink and does not smoke.  She had her last pap smear last month and it was normal.     No Known Allergies Past Medical History  Diagnosis Date  . Herpes   . Pneumonia 2010  . Thyroid disease    Current Outpatient Prescriptions on File Prior to Visit  Medication Sig Dispense Refill  . Aspirin-Salicylamide-Caffeine (BC HEADACHE POWDER PO) Take 1 packet by mouth daily as needed. For headache      . escitalopram (LEXAPRO) 10 MG tablet Take 10 mg by mouth daily.      Marland Kitchen ibuprofen (ADVIL,MOTRIN) 200 MG tablet Take 800 mg by mouth every 8 (eight) hours as needed. For pain      . polyethylene glycol (MIRALAX / GLYCOLAX) packet Take 17 g by mouth daily.  14 each  0  . valACYclovir (VALTREX) 1000 MG tablet Take 1,000 mg by mouth 2 (two) times daily.       No current facility-administered medications on file prior to visit.   Family History  Problem Relation Age of Onset  . Hypertension Mother   . Hypothyroidism Mother   . Diabetes Father   . Hypertension Father    History   Social History  . Marital Status: Single    Spouse Name: N/A    Number of Children: N/A  . Years of Education: N/A   Occupational History  . Not on file.   Social History Main Topics  . Smoking  status: Former Research scientist (life sciences)  . Smokeless tobacco: Never Used  . Alcohol Use: Yes  . Drug Use: No  . Sexual Activity: Yes    Partners: Male    Birth Control/ Protection: Surgical     Comment: Tubal Ligation    Other Topics Concern  . Not on file   Social History Narrative  . No narrative on file   Review of Systems  Constitutional: Positive for malaise/fatigue.       Weight gain (30 lbs) Cold intolerance Brittle hair and nails  Eyes: Negative.   Respiratory: Negative.   Cardiovascular: Negative.   Gastrointestinal: Positive for constipation.  Genitourinary: Negative.   Musculoskeletal: Negative.   Skin: Negative.   Neurological: Positive for weakness and headaches (migraines). Negative for dizziness, tingling and sensory change.  Endo/Heme/Allergies: Negative.   Psychiatric/Behavioral: Negative for depression, suicidal ideas, memory loss and substance abuse. The patient is nervous/anxious and has insomnia.       Objective:   Filed Vitals:   06/27/14 0948  BP: 115/82  Pulse: 94  Temp: 98.7 F (37.1 C)  Resp: 14    Physical Exam: Constitutional: Patient appears well-developed and well-nourished. No distress. HENT: Normocephalic, atraumatic, External  right and left ear normal. Oropharynx is clear and moist.  Eyes: Conjunctivae and EOM are normal. PERRLA, no scleral icterus. Neck: Normal ROM. Neck supple. No JVD. No tracheal deviation. No thyromegaly. CVS: RRR, S1/S2 +, no murmurs, no gallops, no carotid bruit.  Pulmonary: Effort and breath sounds normal, no stridor, rhonchi, wheezes, rales.  Abdominal: Soft. BS +, no distension, tenderness, rebound or guarding.  Musculoskeletal: Normal range of motion. No edema and no tenderness.  Lymphadenopathy: No lymphadenopathy noted, cervical Neuro: Alert. Normal reflexes, muscle tone coordination. No cranial nerve deficit. Skin: Skin is warm and dry. No rash noted. Not diaphoretic. No erythema. No pallor. Psychiatric: Normal mood  and affect. Behavior, judgment, thought content normal.  Lab Results  Component Value Date   WBC 6.9 06/01/2014   HGB 11.5* 06/01/2014   HCT 33.9* 06/01/2014   MCV 82.1 06/01/2014   PLT 251 06/01/2014   Lab Results  Component Value Date   CREATININE 0.69 06/01/2014   BUN 7 06/01/2014   NA 140 06/01/2014   K 3.4* 06/01/2014   CL 101 06/01/2014   CO2 25 06/01/2014    No results found for this basename: HGBA1C   Lipid Panel  No results found for this basename: chol, trig, hdl, cholhdl, vldl, ldlcalc       Assessment and plan:   Patricia Jenkins was seen today for establish care.  Diagnoses and associated orders for this visit:  Unspecified hypothyroidism - escitalopram (LEXAPRO) 10 MG tablet; Take 1 tablet (10 mg total) by mouth daily. - Lipid panel - TSH----will call with medication once I get results - T4, Free - TSH; Future  HSV (herpes simplex virus) infection - valACYclovir (VALTREX) 1000 MG tablet; Take 1 tablet (1,000 mg total) by mouth daily.   Family history of diabetes mellitus (DM) - Tdap vaccine greater than or equal to 7yo IM - Lipid panel - Hemoglobin A1c     Return in about 6 weeks (around 08/08/2014) for Lab Visit-TSH.    Chari Manning, [facility] [phone] 06/27/2014, 10:07 AM

## 2014-06-27 NOTE — Progress Notes (Signed)
Patient presents to establish care  Noted history of hypothyroidism States stopped taking supplementation a year ago due to cost

## 2014-06-27 NOTE — Patient Instructions (Signed)
Hypothyroidism The thyroid is a large gland located in the lower front of your neck. The thyroid gland helps control metabolism. Metabolism is how your body handles food. It controls metabolism with the hormone thyroxine. When this gland is underactive (hypothyroid), it produces too little hormone.  CAUSES These include:   Absence or destruction of thyroid tissue.  Goiter due to iodine deficiency.  Goiter due to medications.  Congenital defects (since birth).  Problems with the pituitary. This causes a lack of TSH (thyroid stimulating hormone). This hormone tells the thyroid to turn out more hormone. SYMPTOMS  Lethargy (feeling as though you have no energy)  Cold intolerance  Weight gain (in spite of normal food intake)  Dry skin  Coarse hair  Menstrual irregularity (if severe, may lead to infertility)  Slowing of thought processes Cardiac problems are also caused by insufficient amounts of thyroid hormone. Hypothyroidism in the newborn is cretinism, and is an extreme form. It is important that this form be treated adequately and immediately or it will lead rapidly to retarded physical and mental development. DIAGNOSIS  To prove hypothyroidism, your caregiver may do blood tests and ultrasound tests. Sometimes the signs are hidden. It may be necessary for your caregiver to watch this illness with blood tests either before or after diagnosis and treatment. TREATMENT  Low levels of thyroid hormone are increased by using synthetic thyroid hormone. This is a safe, effective treatment. It usually takes about four weeks to gain the full effects of the medication. After you have the full effect of the medication, it will generally take another four weeks for problems to leave. Your caregiver may start you on low doses. If you have had heart problems the dose may be gradually increased. It is generally not an emergency to get rapidly to normal. HOME CARE INSTRUCTIONS   Take your  medications as your caregiver suggests. Let your caregiver know of any medications you are taking or start taking. Your caregiver will help you with dosage schedules.  As your condition improves, your dosage needs may increase. It will be necessary to have continuing blood tests as suggested by your caregiver.  Report all suspected medication side effects to your caregiver. SEEK MEDICAL CARE IF: Seek medical care if you develop:  Sweating.  Tremulousness (tremors).  Anxiety.  Rapid weight loss.  Heat intolerance.  Emotional swings.  Diarrhea.  Weakness. SEEK IMMEDIATE MEDICAL CARE IF:  You develop chest pain, an irregular heart beat (palpitations), or a rapid heart beat. MAKE SURE YOU:   Understand these instructions.  Will watch your condition.  Will get help right away if you are not doing well or get worse. Document Released: 11/04/2005 Document Revised: 01/27/2012 Document Reviewed: 06/24/2008 [employer] Patient Information 2015 [employer]. This information is not intended to replace advice given to you by your health care provider. Make sure you discuss any questions you have with your health care provider.  

## 2014-06-29 ENCOUNTER — Telehealth: Payer: Self-pay

## 2014-06-29 ENCOUNTER — Telehealth: Payer: Self-pay | Admitting: Internal Medicine

## 2014-06-29 NOTE — Telephone Encounter (Signed)
Message copied by Theodis Shove on Wed Jun 29, 2014  5:37 PM ------      Message from: Chari Manning A      Created: Wed Jun 29, 2014  2:38 PM       Please call patient and let her know that although she has been out her thyroid medication for over one year, her TSH is normal.  Let her know that I will not start her on medication now but I will just repeat the labs in 6 months.  Let her know to try to decrease the stress and maybe this will help with some of the weight gain. She may use miralax as needed for constipation ------

## 2014-06-29 NOTE — Telephone Encounter (Signed)
Pt needs to know Lab results Please f/u with Pt

## 2014-06-29 NOTE — Telephone Encounter (Signed)
Returned patient's call regarding lab results. Informed patient that results have not been reviewed and interpeted by PCP. Also informed patient that if any prescriptions are needed the PCP will send it to the preferred pharmacy. Patient verbalized understanding. Vivia Birmingham, RN

## 2014-06-29 NOTE — Telephone Encounter (Signed)
Patient notified of lab results and need for repeat labs in 6 months. Informed to work on reducing stress and informed that she is able to use Miralax as needed for constipation. Patient verbalized understanding and appreciative of information.

## 2014-08-08 ENCOUNTER — Ambulatory Visit: Payer: Medicaid Other | Attending: Internal Medicine

## 2014-08-08 DIAGNOSIS — E039 Hypothyroidism, unspecified: Secondary | ICD-10-CM

## 2014-08-08 LAB — TSH: TSH: 1.803 u[IU]/mL (ref 0.350–4.500)

## 2014-08-11 ENCOUNTER — Telehealth: Payer: Self-pay | Admitting: Emergency Medicine

## 2014-08-11 NOTE — Telephone Encounter (Signed)
Pt denies taking Thyroid medication. Instructed pt she does not need to start at this time

## 2014-08-11 NOTE — Telephone Encounter (Signed)
Message copied by Ricci Barker on Thu Aug 11, 2014  5:02 PM ------      Message from: Patricia Jenkins      Created: Tue Aug 09, 2014  2:08 PM       Find out if she has been taking thyroid medication, none shown in system? Her last two TSH levels were normal--if not on medication, she does not need any. Thanks ------

## 2014-08-24 ENCOUNTER — Ambulatory Visit: Payer: Medicaid Other | Admitting: Internal Medicine

## 2014-08-31 ENCOUNTER — Ambulatory Visit: Payer: Medicaid Other | Attending: Internal Medicine | Admitting: Internal Medicine

## 2014-08-31 ENCOUNTER — Encounter: Payer: Self-pay | Admitting: Internal Medicine

## 2014-08-31 VITALS — BP 125/89 | HR 88 | Temp 98.5°F | Resp 16 | Ht 70.0 in | Wt 237.0 lb

## 2014-08-31 DIAGNOSIS — K5900 Constipation, unspecified: Secondary | ICD-10-CM | POA: Insufficient documentation

## 2014-08-31 DIAGNOSIS — K5909 Other constipation: Secondary | ICD-10-CM

## 2014-08-31 NOTE — Progress Notes (Signed)
Pt is here today stating that she has chronic constipation.

## 2014-08-31 NOTE — Patient Instructions (Signed)

## 2014-08-31 NOTE — Progress Notes (Signed)
Patient ID: Patricia Jenkins, female   DOB: 1981-09-18, 33 y.o.   MRN: [id number]  CC: chronic constipation   HPI:  Patient reports that she has been dealing with chronic constipation for the past twelve years.  Patient reports that she has increased her fiber intake and water.  She states that she has abdominal cramping when she has a bowel movement.  The abdominal pain feels like pressure or weight in her stomach.  Patient has tried several agents for constipation including miralax, colace, colon cleanse, phillips, increased water, fiber bars, metamucil.  No blood in stool noted.  No Known Allergies Past Medical History  Diagnosis Date  . Herpes   . Pneumonia 2010  . Thyroid disease    Current Outpatient Prescriptions on File Prior to Visit  Medication Sig Dispense Refill  . escitalopram (LEXAPRO) 10 MG tablet Take 1 tablet (10 mg total) by mouth daily.  30 tablet  4  . valACYclovir (VALTREX) 1000 MG tablet Take 1 tablet (1,000 mg total) by mouth daily.  30 tablet  3  . Aspirin-Salicylamide-Caffeine (BC HEADACHE POWDER PO) Take 1 packet by mouth daily as needed. For headache      . ibuprofen (ADVIL,MOTRIN) 200 MG tablet Take 800 mg by mouth every 8 (eight) hours as needed. For pain      . polyethylene glycol (MIRALAX / GLYCOLAX) packet Take 17 g by mouth daily.  14 each  0   No current facility-administered medications on file prior to visit.   Family History  Problem Relation Age of Onset  . Hypertension Mother   . Hypothyroidism Mother   . Diabetes Father   . Hypertension Father    History   Social History  . Marital Status: Single    Spouse Name: N/A    Number of Children: N/A  . Years of Education: N/A   Occupational History  . Not on file.   Social History Main Topics  . Smoking status: Former Research scientist (life sciences)  . Smokeless tobacco: Never Used  . Alcohol Use: Yes  . Drug Use: No  . Sexual Activity: Yes    Partners: Male    Birth Control/ Protection: Surgical     Comment: Tubal  Ligation    Other Topics Concern  . Not on file   Social History Narrative  . No narrative on file    Review of Systems: See HPI   Objective:   Filed Vitals:   08/31/14 1130  BP: 125/89  Pulse: 88  Temp: 98.5 F (36.9 C)  Resp: 16    Physical Exam  Cardiovascular: Normal rate, regular rhythm and normal heart sounds.   Pulmonary/Chest: Effort normal and breath sounds normal.  Abdominal: Soft. Bowel sounds are normal. She exhibits no distension. There is tenderness (generalized). There is no rebound and no guarding.  Neurological: She is alert.  Skin: Skin is warm and dry.     Lab Results  Component Value Date   WBC 6.9 06/01/2014   HGB 11.5* 06/01/2014   HCT 33.9* 06/01/2014   MCV 82.1 06/01/2014   PLT 251 06/01/2014   Lab Results  Component Value Date   CREATININE 0.69 06/01/2014   BUN 7 06/01/2014   NA 140 06/01/2014   K 3.4* 06/01/2014   CL 101 06/01/2014   CO2 25 06/01/2014    Lab Results  Component Value Date   HGBA1C 5.7* 06/27/2014   Lipid Panel     Component Value Date/Time   CHOL 177 06/27/2014 1050  TRIG 142 06/27/2014 1050   HDL 47 06/27/2014 1050   CHOLHDL 3.8 06/27/2014 1050   VLDL 28 06/27/2014 1050   LDLCALC 102* 06/27/2014 1050       Assessment and plan:   Patricia Jenkins was seen today for follow-up.  Diagnoses and associated orders for this visit:  Chronic constipation - Ambulatory referral to Gastroenterology Offered patient Baker Pierini but she prefers to wait on GI for further evaluation   Return if symptoms worsen or fail to improve.        Chari Manning, [facility] [phone] 08/31/2014, 11:54 AM

## 2014-09-14 ENCOUNTER — Emergency Department (HOSPITAL_COMMUNITY)
Admission: EM | Admit: 2014-09-14 | Discharge: 2014-09-14 | Disposition: A | Discharge status: Home / Self Care | Payer: Medicaid Other | Source: Home / Self Care | Attending: Emergency Medicine | Admitting: Emergency Medicine

## 2014-09-14 ENCOUNTER — Encounter (HOSPITAL_COMMUNITY): Payer: Self-pay | Admitting: Emergency Medicine

## 2014-09-14 DIAGNOSIS — J209 Acute bronchitis, unspecified: Secondary | ICD-10-CM

## 2014-09-14 DIAGNOSIS — J039 Acute tonsillitis, unspecified: Secondary | ICD-10-CM

## 2014-09-14 LAB — POCT RAPID STREP A: Streptococcus, Group A Screen (Direct): NEGATIVE

## 2014-09-14 MED ORDER — PREDNISONE 20 MG PO TABS: 20.0000 mg | ORAL_TABLET | Freq: Two times a day (BID) | ORAL | Status: DC

## 2014-09-14 MED ORDER — AMOXICILLIN 500 MG PO CAPS: 500.0000 mg | ORAL_CAPSULE | Freq: Three times a day (TID) | ORAL | Status: DC

## 2014-09-14 MED ORDER — ALBUTEROL SULFATE HFA 108 (90 BASE) MCG/ACT IN AERS: 2.0000 | INHALATION_SPRAY | Freq: Four times a day (QID) | RESPIRATORY_TRACT | Status: DC

## 2014-09-14 MED ORDER — GUAIFENESIN-CODEINE 100-10 MG/5ML PO SYRP: 5.0000 mL | ORAL_SOLUTION | Freq: Three times a day (TID) | ORAL | Status: DC | PRN

## 2014-09-14 NOTE — ED Provider Notes (Signed)
Chief Complaint   Sore throat and nasal congestion.   History of Present Illness   Patricia Jenkins is a 33 year old female who has had a four-day history of sore throat, difficulty breathing, chest tightness, aching in her back, headache, nausea, diarrhea, low-grade fever to 99.9, dry cough, chest pain, nasal congestion, headache, and ear congestion. She has had no definite sick exposures. She's tried Mucinex and Alka-Seltzer.  Review of Systems   Other than as noted above, the patient denies any of the following symptoms: Systemic:  No fevers, chills, sweats, or myalgias. Eye:  No redness or discharge. ENT:  No ear pain, headache, nasal congestion, drainage, sinus pressure, or sore throat. Neck:  No neck pain, stiffness, or swollen glands. Lungs:  No cough, sputum production, hemoptysis, wheezing, chest tightness, shortness of breath or chest pain. GI:  No abdominal pain, nausea, vomiting or diarrhea.  [place]   Past medical history, family history, social history, meds, and allergies were reviewed. She takes Lexapro.  Physical exam   Vital signs:  BP 122/87  Pulse 104  Temp(Src) 99.1 F (37.3 C) (Oral)  Resp 16  SpO2 97%  LMP 09/08/2014 General:  Alert and oriented.  In no distress.  Skin warm and dry. Eye:  No conjunctival injection or drainage. Lids were normal. ENT:  TMs and canals were normal, without erythema or inflammation.  Nasal mucosa was clear and uncongested, without drainage.  Mucous membranes were moist.  Tonsils were enlarged and red with spots of white exudate.  There were no oral ulcerations or lesions. Neck:  Supple, no adenopathy, tenderness or mass. Lungs:  No respiratory distress.  Lungs were clear to auscultation, without wheezes, rales or rhonchi.  Breath sounds were clear and equal bilaterally.  Heart:  Regular rhythm, without gallops, murmers or rubs. Skin:  Clear, warm, and dry, without rash or lesions.  Labs   Results for orders placed during the  hospital encounter of 09/14/14  POCT RAPID STREP A ([facility]RG CARE ONLY)      Result Value Ref Range   Streptococcus, Group A Screen (Direct) NEGATIVE  NEGATIVE    Assessment     The primary encounter diagnosis was Tonsillitis. A diagnosis of Acute bronchitis, unspecified organism was also pertinent to this visit.  There is no evidence of pneumonia, strep throat, sinusitis, otitis media.    Plan    1.  Meds:  The following meds were prescribed:   Discharge Medication List as of 09/14/2014  2:04 PM    START taking these medications   Details  albuterol (PROVENTIL HFA;VENTOLIN HFA) 108 (90 BASE) MCG/ACT inhaler Inhale 2 puffs into the lungs 4 (four) times daily., Starting 09/14/2014, Until Discontinued, Normal    amoxicillin (AMOXIL) 500 MG capsule Take 1 capsule (500 mg total) by mouth 3 (three) times daily., Starting 09/14/2014, Until Discontinued, Normal    guaiFENesin-codeine (ROBITUSSIN AC) 100-10 MG/5ML syrup Take 5 mLs by mouth 3 (three) times daily as needed for cough., Starting 09/14/2014, Until Discontinued, Print    predniSONE (DELTASONE) 20 MG tablet Take 1 tablet (20 mg total) by mouth 2 (two) times daily., Starting 09/14/2014, Until Discontinued, Normal        2.  Patient Education/Counseling:  The patient was given appropriate handouts, self care instructions, and instructed in symptomatic relief.  Instructed to get extra fluids and extra rest.    3.  Follow up:  The patient was told to follow up here if no better in 3 to 4 days, or  sooner if becoming worse in any way, and given some red flag symptoms such as increasing fever, difficulty breathing, chest pain, or persistent vomiting which would prompt immediate return.       Harden Mo, MD 09/14/14 [id number]

## 2014-09-14 NOTE — Discharge Instructions (Signed)

## 2014-09-16 LAB — CULTURE, GROUP A STREP

## 2014-09-19 ENCOUNTER — Encounter (HOSPITAL_COMMUNITY): Payer: Self-pay | Admitting: Emergency Medicine

## 2014-09-29 ENCOUNTER — Emergency Department (HOSPITAL_COMMUNITY)
Admission: EM | Admit: 2014-09-29 | Discharge: 2014-09-29 | Disposition: A | Discharge status: Home / Self Care | Payer: Medicaid Other | Source: Home / Self Care | Attending: Family Medicine | Admitting: Family Medicine

## 2014-09-29 ENCOUNTER — Encounter (HOSPITAL_COMMUNITY): Payer: Self-pay | Admitting: Emergency Medicine

## 2014-09-29 DIAGNOSIS — J9801 Acute bronchospasm: Secondary | ICD-10-CM

## 2014-09-29 DIAGNOSIS — R0982 Postnasal drip: Secondary | ICD-10-CM

## 2014-09-29 MED ORDER — BECLOMETHASONE DIPROPIONATE 80 MCG/ACT IN AERS: 1.0000 | INHALATION_SPRAY | Freq: Two times a day (BID) | RESPIRATORY_TRACT | Status: DC

## 2014-09-29 NOTE — ED Notes (Addendum)
Coughing, cough has continued.  Worse at night, disrupting sleep.

## 2014-09-29 NOTE — ED Provider Notes (Signed)
CSN: [id number]     Arrival date & time 09/29/14  1635 History   First MD Initiated Contact with Patient 09/29/14 1738     Chief Complaint  Patient presents with  . Cough   (Consider location/radiation/quality/duration/timing/severity/associated sxs/prior Treatment) HPI Comments: Persistent cough for over 2 weeks. Seen here 10/28 for same associated with URI sx's. Given ABX, albuterol HFA and cough med. Has not been using Albuterol as did not perceive she was wheezing.   Past Medical History  Diagnosis Date  . Herpes   . Pneumonia 2010  . Thyroid disease    Past Surgical History  Procedure Laterality Date  . Dilation and curettage of uterus    . Tubal ligation    . Cholecystectomy  10/03/2011    Procedure: LAPAROSCOPIC CHOLECYSTECTOMY WITH INTRAOPERATIVE CHOLANGIOGRAM;  Surgeon: Harl Bowie, MD;  Location: [facility];  Service: General;  Laterality: N/A;   Family History  Problem Relation Age of Onset  . Hypertension Mother   . Hypothyroidism Mother   . Diabetes Father   . Hypertension Father    History  Substance Use Topics  . Smoking status: Former Research scientist (life sciences)  . Smokeless tobacco: Never Used  . Alcohol Use: Yes   OB History    Gravida Para Term Preterm AB TAB SAB Ectopic Multiple Living   4 3 3  1  1   3      Review of Systems  Constitutional: Negative for fever and diaphoresis.  HENT: Positive for postnasal drip. Negative for ear pain and sore throat.   Respiratory: Positive for cough. Negative for shortness of breath and wheezing.   Cardiovascular: Negative for chest pain.  Musculoskeletal: Negative.     Allergies  Review of patient's allergies indicates no known allergies.  Home Medications   Prior to Admission medications   Medication Sig Start Date End Date Taking? Authorizing Provider  albuterol (PROVENTIL HFA;VENTOLIN HFA) 108 (90 BASE) MCG/ACT inhaler Inhale 2 puffs into the lungs 4 (four) times daily. 09/14/14   Harden Mo, MD  amoxicillin  (AMOXIL) 500 MG capsule Take 1 capsule (500 mg total) by mouth 3 (three) times daily. 09/14/14   Harden Mo, MD  Aspirin-Salicylamide-Caffeine [facility] HEADACHE POWDER PO) Take 1 packet by mouth daily as needed. For headache    Historical Provider, MD  beclomethasone (QVAR) 80 MCG/ACT inhaler Inhale 1 puff into the lungs 2 (two) times daily. 09/29/14   Janne Napoleon, NP  escitalopram (LEXAPRO) 10 MG tablet Take 1 tablet (10 mg total) by mouth daily. 06/27/14   Lance Bosch, NP  guaiFENesin-codeine (ROBITUSSIN AC) 100-10 MG/5ML syrup Take 5 mLs by mouth 3 (three) times daily as needed for cough. 09/14/14   Harden Mo, MD  ibuprofen (ADVIL,MOTRIN) 200 MG tablet Take 800 mg by mouth every 8 (eight) hours as needed. For pain    Historical Provider, MD  polyethylene glycol (MIRALAX / GLYCOLAX) packet Take 17 g by mouth daily. 06/02/14   Malvin Johns, MD  predniSONE (DELTASONE) 20 MG tablet Take 1 tablet (20 mg total) by mouth 2 (two) times daily. 09/14/14   Harden Mo, MD  valACYclovir (VALTREX) 1000 MG tablet Take 1 tablet (1,000 mg total) by mouth daily. 06/27/14   Lance Bosch, NP   BP 109/78 mmHg  Pulse 94  Temp(Src) 98.3 F (36.8 C) (Oral)  Resp 16  SpO2 100%  LMP 09/08/2014 Physical Exam  Constitutional: She is oriented to person, place, and time. She appears well-developed and well-nourished. No  distress.  HENT:  Bilat TM's nl OP with minor erythema and clear PND  Eyes: Conjunctivae and EOM are normal.  Neck: Normal range of motion. Neck supple.  Cardiovascular: Normal rate and regular rhythm.   Pulmonary/Chest: Effort normal.  Distant wheeze with forced expiration and cough . Otherwise clear with good air movement.   Musculoskeletal: Normal range of motion. She exhibits no edema.  Lymphadenopathy:    She has no cervical adenopathy.  Neurological: She is alert and oriented to person, place, and time.  Skin: Skin is warm and dry. No rash noted.  Psychiatric: She has a normal  mood and affect.  Nursing note and vitals reviewed.   ED Course  Procedures (including critical care time) Labs Review Labs Reviewed - No data to display  Imaging Review No results found.   MDM   1. Cough due to bronchospasm   2. PND (post-nasal drip)    Albuterol hfa use q 4h for cough Qvar *0 bid Allegra or claritin for drainage.   Janne Napoleon, NP 09/29/14 1810

## 2014-09-29 NOTE — Discharge Instructions (Signed)
Bronchospasm °A bronchospasm is a spasm or tightening of the airways going into the lungs. During a bronchospasm breathing becomes more difficult because the airways get smaller. When this happens there can be coughing, a whistling sound when breathing (wheezing), and difficulty breathing. Bronchospasm is often associated with asthma, but not all patients who experience a bronchospasm have asthma. °CAUSES  °A bronchospasm is caused by inflammation or irritation of the airways. The inflammation or irritation may be triggered by:  °1. Allergies (such as to animals, pollen, food, or mold). Allergens that cause bronchospasm may cause wheezing immediately after exposure or many hours later.   °2. Infection. Viral infections are believed to be the most common cause of bronchospasm.   °3. Exercise.   °4. Irritants (such as pollution, cigarette smoke, strong odors, aerosol sprays, and paint fumes).   °5. Weather changes. Winds increase molds and pollens in the air. Rain refreshes the air by washing irritants out. Cold air may cause inflammation.   °6. Stress and emotional upset.   °SIGNS AND SYMPTOMS  °· Wheezing.   °· Excessive nighttime coughing.   °· Frequent or severe coughing with a simple cold.   °· Chest tightness.   °· Shortness of breath.   °DIAGNOSIS  °Bronchospasm is usually diagnosed through a history and physical exam. Tests, such as chest X-rays, are sometimes done to look for other conditions. °TREATMENT  °· Inhaled medicines can be given to open up your airways and help you breathe. The medicines can be given using either an inhaler or a nebulizer machine. °· Corticosteroid medicines may be given for severe bronchospasm, usually when it is associated with asthma. °HOME CARE INSTRUCTIONS  °· Always have a plan prepared for seeking medical care. Know when to call your health care provider and local emergency services (911 in the [place] Know where you can access local emergency care. °· Only take medicines as  directed by your health care provider. °· If you were prescribed an inhaler or nebulizer machine, ask your health care provider to explain how to use it correctly. Always use a spacer with your inhaler if you were given one. °· It is necessary to remain calm during an attack. Try to relax and breathe more slowly.  °· Control your home environment in the following ways:   °· Change your heating and air conditioning filter at least once a month.   °· Limit your use of fireplaces and wood stoves. °· Do not smoke and do not allow smoking in your home.   °· Avoid exposure to perfumes and fragrances.   °· Get rid of pests (such as roaches and mice) and their droppings.   °· Throw away plants if you see mold on them.   °· Keep your house clean and dust free.   °· Replace carpet with wood, tile, or vinyl flooring. Carpet can trap dander and dust.   °· Use allergy-proof pillows, mattress covers, and box spring covers.   °· Wash bed sheets and blankets every week in hot water and dry them in a dryer.   °· Use blankets that are made of polyester or cotton.   °· Wash hands frequently. °SEEK MEDICAL CARE IF:  °· You have muscle aches.   °· You have chest pain.   °· The sputum changes from clear or white to yellow, green, gray, or bloody.   °· The sputum you cough up gets thicker.   °· There are problems that may be related to the medicine you are given, such as a rash, itching, swelling, or trouble breathing.   °SEEK IMMEDIATE MEDICAL CARE IF:  °· You have worsening wheezing and coughing even   after taking your prescribed medicines.   You have increased difficulty breathing.   You develop severe chest pain. MAKE SURE YOU:   Understand these instructions.  Will watch your condition.  Will get help right away if you are not doing well or get worse. Document Released: 11/07/2003 Document Revised: 11/09/2013 Document Reviewed: 04/26/2013 [facility] Patient Information 2015 [place], [place]. This information is not  intended to replace advice given to you by your health care provider. Make sure you discuss any questions you have with your health care provider.  Cough, Adult  A cough is a reflex. It helps you clear your throat and airways. A cough can help heal your body. A cough can last 2 or 3 weeks (acute) or may last more than 8 weeks (chronic). Some common causes of a cough can include an infection, allergy, or a cold. HOME CARE 7. Only take medicine as told by your doctor. 8. If given, take your medicines (antibiotics) as told. Finish them even if you start to feel better. 9. Use a cold steam vaporizer or humidifier in your home. This can help loosen thick spit (secretions). 10. Sleep so you are almost sitting up (semi-upright). Use pillows to do this. This helps reduce coughing. 11. Rest as needed. 12. Stop smoking if you smoke. GET HELP RIGHT AWAY IF:  You have yellowish-white fluid (pus) in your thick spit.  Your cough gets worse.  Your medicine does not reduce coughing, and you are losing sleep.  You cough up blood.  You have trouble breathing.  Your pain gets worse and medicine does not help.  You have a fever. MAKE SURE YOU:   Understand these instructions.  Will watch your condition.  Will get help right away if you are not doing well or get worse. Document Released: 07/18/2011 Document Revised: 03/21/2014 Document Reviewed: 07/18/2011 [facility] Patient Information 2015 [place], [place]. This information is not intended to replace advice given to you by your health care provider. Make sure you discuss any questions you have with your health care provider.  How to Use an Inhaler Using your inhaler correctly is very important. Good technique will make sure that the medicine reaches your lungs.  HOW TO USE AN INHALER: 13. Take the cap off the inhaler. 14. If this is the first time using your inhaler, you need to prime it. Shake the inhaler for 5 seconds. Release four puffs into the  air, away from your face. Ask your doctor for help if you have questions. 15. Shake the inhaler for 5 seconds. 16. Turn the inhaler so the bottle is above the mouthpiece. 17. Put your pointer finger on top of the bottle. Your thumb holds the bottom of the inhaler. 18. Open your mouth. 19. Either hold the inhaler away from your mouth (the width of 2 fingers) or place your lips tightly around the mouthpiece. Ask your doctor which way to use your inhaler. 20. Breathe out as much air as possible. 21. Breathe in and push down on the bottle 1 time to release the medicine. You will feel the medicine go in your mouth and throat. 22. Continue to take a deep breath in very slowly. Try to fill your lungs. 23. After you have breathed in completely, hold your breath for 10 seconds. This will help the medicine to settle in your lungs. If you cannot hold your breath for 10 seconds, hold it for as long as you can before you breathe out. 24. Breathe out slowly, through pursed lips.  Whistling is an example of pursed lips. 25. If your doctor has told you to take more than 1 puff, wait at least 15-30 seconds between puffs. This will help you get the best results from your medicine. Do not use the inhaler more than your doctor tells you to. 26. Put the cap back on the inhaler. 27. Follow the directions from your doctor or from the inhaler package about cleaning the inhaler. If you use more than one inhaler, ask your doctor which inhalers to use and what order to use them in. Ask your doctor to help you figure out when you will need to refill your inhaler.  If you use a steroid inhaler, always rinse your mouth with water after your last puff, gargle and spit out the water. Do not swallow the water. GET HELP IF:  The inhaler medicine only partially helps to stop wheezing or shortness of breath.  You are having trouble using your inhaler.  You have some increase in thick spit (phlegm). GET HELP RIGHT AWAY IF:  The  inhaler medicine does not help your wheezing or shortness of breath or you have tightness in your chest.  You have dizziness, headaches, or fast heart rate.  You have chills, fever, or night sweats.  You have a large increase of thick spit, or your thick spit is bloody. MAKE SURE YOU:   Understand these instructions.  Will watch your condition.  Will get help right away if you are not doing well or get worse. Document Released: 08/13/2008 Document Revised: 08/25/2013 Document Reviewed: 06/03/2013 [facility] Patient Information 2015 [place], [place]. This information is not intended to replace advice given to you by your health care provider. Make sure you discuss any questions you have with your health care provider.

## 2014-10-03 ENCOUNTER — Ambulatory Visit: Payer: Medicaid Other | Admitting: Internal Medicine

## 2014-10-18 ENCOUNTER — Encounter (HOSPITAL_COMMUNITY): Payer: Self-pay | Admitting: Emergency Medicine

## 2014-10-18 ENCOUNTER — Emergency Department (HOSPITAL_COMMUNITY): Payer: Medicaid Other

## 2014-10-18 ENCOUNTER — Emergency Department (HOSPITAL_COMMUNITY)
Admission: EM | Admit: 2014-10-18 | Discharge: 2014-10-18 | Disposition: A | Discharge status: Home / Self Care | Payer: Medicaid Other | Attending: Emergency Medicine | Admitting: Emergency Medicine

## 2014-10-18 DIAGNOSIS — R0789 Other chest pain: Secondary | ICD-10-CM | POA: Diagnosis not present

## 2014-10-18 DIAGNOSIS — Z79899 Other long term (current) drug therapy: Secondary | ICD-10-CM | POA: Diagnosis not present

## 2014-10-18 DIAGNOSIS — R059 Cough, unspecified: Secondary | ICD-10-CM

## 2014-10-18 DIAGNOSIS — R51 Headache: Secondary | ICD-10-CM | POA: Diagnosis not present

## 2014-10-18 DIAGNOSIS — Z792 Long term (current) use of antibiotics: Secondary | ICD-10-CM | POA: Diagnosis not present

## 2014-10-18 DIAGNOSIS — J069 Acute upper respiratory infection, unspecified: Secondary | ICD-10-CM | POA: Diagnosis not present

## 2014-10-18 DIAGNOSIS — Z8639 Personal history of other endocrine, nutritional and metabolic disease: Secondary | ICD-10-CM | POA: Insufficient documentation

## 2014-10-18 DIAGNOSIS — Z8701 Personal history of pneumonia (recurrent): Secondary | ICD-10-CM | POA: Diagnosis not present

## 2014-10-18 DIAGNOSIS — H9213 Otorrhea, bilateral: Secondary | ICD-10-CM | POA: Diagnosis not present

## 2014-10-18 DIAGNOSIS — R05 Cough: Secondary | ICD-10-CM | POA: Diagnosis present

## 2014-10-18 DIAGNOSIS — Z8619 Personal history of other infectious and parasitic diseases: Secondary | ICD-10-CM | POA: Insufficient documentation

## 2014-10-18 DIAGNOSIS — Z7952 Long term (current) use of systemic steroids: Secondary | ICD-10-CM | POA: Insufficient documentation

## 2014-10-18 DIAGNOSIS — R058 Other specified cough: Secondary | ICD-10-CM

## 2014-10-18 DIAGNOSIS — J3489 Other specified disorders of nose and nasal sinuses: Secondary | ICD-10-CM

## 2014-10-18 DIAGNOSIS — R0981 Nasal congestion: Secondary | ICD-10-CM

## 2014-10-18 MED ORDER — FLUTICASONE PROPIONATE 50 MCG/ACT NA SUSP: 2.0000 | Freq: Every day | NASAL | Status: DC

## 2014-10-18 NOTE — Discharge Instructions (Signed)
Continue to stay well-hydrated. Gargle warm salt water and spit it out. Continue to alternate between Tylenol and Ibuprofen for pain or fever. Use Mucinex for cough suppression/expectoration of mucus. Use netipot/nasal saline and flonase to help with nasal congestion and irritation. May consider over-the-counter Benadryl or other antihistamine (Allegra) to decrease secretions and for watery itchy eyes. Followup with your primary care doctor in 5-7 days for recheck of ongoing symptoms. See the pulmonologist as needed for ongoing management of your cough and breathing issues, in order to formally evaluate any asthma or allergy component you may have. Return to emergency department for emergent changing or worsening of symptoms. Continue to use inhaler as directed, as needed for cough/chest congestion.   Cough, Adult  A cough is a reflex that helps clear your throat and airways. It can help heal the body or may be a reaction to an irritated airway. A cough may only last 2 or 3 weeks (acute) or may last more than 8 weeks (chronic).  CAUSES Acute cough:  Viral or bacterial infections. Chronic cough:  Infections.  Allergies.  Asthma.  Post-nasal drip.  Smoking.  Heartburn or acid reflux.  Some medicines.  Chronic lung problems (COPD).  Cancer. SYMPTOMS   Cough.  Fever.  Chest pain.  Increased breathing rate.  High-pitched whistling sound when breathing (wheezing).  Colored mucus that you cough up (sputum). TREATMENT   A bacterial cough may be treated with antibiotic medicine.  A viral cough must run its course and will not respond to antibiotics.  Your caregiver may recommend other treatments if you have a chronic cough. HOME CARE INSTRUCTIONS   Only take over-the-counter or prescription medicines for pain, discomfort, or fever as directed by your caregiver. Use cough suppressants only as directed by your caregiver.  Use a cold steam vaporizer or humidifier in your  bedroom or home to help loosen secretions.  Sleep in a semi-upright position if your cough is worse at night.  Rest as needed.  Stop smoking if you smoke. SEEK IMMEDIATE MEDICAL CARE IF:   You have pus in your sputum.  Your cough starts to worsen.  You cannot control your cough with suppressants and are losing sleep.  You begin coughing up blood.  You have difficulty breathing.  You develop pain which is getting worse or is uncontrolled with medicine.  You have a fever. MAKE SURE YOU:   Understand these instructions.  Will watch your condition.  Will get help right away if you are not doing well or get worse. Document Released: 05/03/2011 Document Revised: 01/27/2012 Document Reviewed: 05/03/2011 [facility] Patient Information 2015 [place], [place]. This information is not intended to replace advice given to you by your health care provider. Make sure you discuss any questions you have with your health care provider.  Upper Respiratory Infection, Adult An upper respiratory infection (URI) is also known as the common cold. It is often caused by a type of germ (virus). Colds are easily spread (contagious). You can pass it to others by kissing, coughing, sneezing, or drinking out of the same glass. Usually, you get better in 1 or 2 weeks.  HOME CARE   Only take medicine as told by your doctor.  Use a warm mist humidifier or breathe in steam from a hot shower.  Drink enough water and fluids to keep your pee (urine) clear or pale yellow.  Get plenty of rest.  Return to work when your temperature is back to normal or as told by your doctor.  You may use a face mask and wash your hands to stop your cold from spreading. GET HELP RIGHT AWAY IF:   After the first few days, you feel you are getting worse.  You have questions about your medicine.  You have chills, shortness of breath, or brown or red spit (mucus).  You have yellow or brown snot (nasal discharge) or pain in the  face, especially when you bend forward.  You have a fever, puffy (swollen) neck, pain when you swallow, or white spots in the back of your throat.  You have a bad headache, ear pain, sinus pain, or chest pain.  You have a high-pitched whistling sound when you breathe in and out (wheezing).  You have a lasting cough or cough up blood.  You have sore muscles or a stiff neck. MAKE SURE YOU:   Understand these instructions.  Will watch your condition.  Will get help right away if you are not doing well or get worse. Document Released: 04/22/2008 Document Revised: 01/27/2012 Document Reviewed: 02/09/2014 [facility] Patient Information 2015 [place], [place]. This information is not intended to replace advice given to you by your health care provider. Make sure you discuss any questions you have with your health care provider.

## 2014-10-18 NOTE — ED Provider Notes (Signed)
CSN: [id number]     Arrival date & time 10/18/14  1027 History   First MD Initiated Contact with Patient 10/18/14 1118     Chief Complaint  Patient presents with  . Cough     (Consider location/radiation/quality/duration/timing/severity/associated sxs/prior Treatment) HPI Comments: Patricia Jenkins is a 33 y.o. female with a PMHx of herpes, PNA, and thyroid disease, who presents to the ED with complaints of recurrent cough. Patient reports that in October she was diagnosed with bronchitis and tonsillitis given antibiotics and her symptoms improved except for a cough which persisted, then she returned to the urgent care and was diagnosed with bronchospasm and given Qvar and albuterol which has been helping her symptoms and then last week she developed a "cold" which has caused her cough to worsen again. She reports a productive cough with green sputum, sinus pressure, yellow rhinorrhea, sore throat which she describes as dryness, and occasional sinus pressure headaches which she has used Sudafed and Alka-Seltzer with relief. She continues to use her inhalers which are helping her breathing. She states that occasionally she gets a tightness in her chest with coughing, but this is not ongoing at this time. Additionally she reports occasional nausea. She denies any known aggravating factors for her symptoms. She denies any fevers, chills, chest pain, shortness of breath, wheezing, on a pain/itching/drainage, ear pain/drainage, abdominal pain, vomiting, diarrhea, constipation, melena, hematochezia, urinary symptoms, vaginal symptoms, LE swelling, recent travel, sick contacts, or known allergies. She has never been diagnosed with asthma. She is no longer a smoker. She is concerned that she may have pneumonia, given that she has never gotten a chest x-ray and she is requesting this today. Endorses getting a flu shot this year. No estrogen use.  Patient is a 33 y.o. female presenting with cough. The history is  provided by the patient. No language interpreter was used.  Cough Cough characteristics:  Productive Sputum characteristics:  Green Severity:  Moderate Onset quality:  Gradual Duration:  1 week Timing:  Constant Progression:  Unchanged Chronicity:  Recurrent Smoker: no   Context: upper respiratory infection   Context: not sick contacts   Relieved by:  Beta-agonist inhaler, steroid inhaler and decongestant (qvar, albuterol, alka seltzer, and sudafed) Worsened by:  Nothing tried Ineffective treatments:  None tried Associated symptoms: ear fullness, headaches, rhinorrhea, sinus congestion and sore throat   Associated symptoms: no chest pain, no chills, no diaphoresis, no ear pain, no eye discharge, no fever, no myalgias, no rash, no shortness of breath and no wheezing   Risk factors: recent infection     Past Medical History  Diagnosis Date  . Herpes   . Pneumonia 2010  . Thyroid disease    Past Surgical History  Procedure Laterality Date  . Dilation and curettage of uterus    . Tubal ligation    . Cholecystectomy  10/03/2011    Procedure: LAPAROSCOPIC CHOLECYSTECTOMY WITH INTRAOPERATIVE CHOLANGIOGRAM;  Surgeon: Harl Bowie, MD;  Location: [place];  Service: General;  Laterality: N/A;   Family History  Problem Relation Age of Onset  . Hypertension Mother   . Hypothyroidism Mother   . Diabetes Father   . Hypertension Father    History  Substance Use Topics  . Smoking status: Former Research scientist (life sciences)  . Smokeless tobacco: Never Used  . Alcohol Use: Yes   OB History    Gravida Para Term Preterm AB TAB SAB Ectopic Multiple Living   4 3 3  1  1    3  Review of Systems  Constitutional: Negative for fever, chills and diaphoresis.  HENT: Positive for congestion, rhinorrhea, sinus pressure and sore throat. Negative for ear discharge, ear pain and trouble swallowing.   Eyes: Negative for discharge, redness and itching.  Respiratory: Positive for cough and chest tightness  (intermittent). Negative for shortness of breath and wheezing.   Cardiovascular: Negative for chest pain and leg swelling.  Gastrointestinal: Positive for nausea. Negative for vomiting, abdominal pain, diarrhea, constipation and blood in stool.  Genitourinary: Negative for dysuria, hematuria, vaginal bleeding and vaginal discharge.  Musculoskeletal: Negative for myalgias, arthralgias and neck pain.  Skin: Negative for rash.  Neurological: Positive for headaches. Negative for dizziness, syncope, weakness, light-headedness and numbness.  Hematological: Negative for adenopathy.      Allergies  Review of patient's allergies indicates no known allergies.  Home Medications   Prior to Admission medications   Medication Sig Start Date End Date Taking? Authorizing Provider  albuterol (PROVENTIL HFA;VENTOLIN HFA) 108 (90 BASE) MCG/ACT inhaler Inhale 2 puffs into the lungs 4 (four) times daily. 09/14/14   Harden Mo, MD  amoxicillin (AMOXIL) 500 MG capsule Take 1 capsule (500 mg total) by mouth 3 (three) times daily. 09/14/14   Harden Mo, MD  Aspirin-Salicylamide-Caffeine [facility] HEADACHE POWDER PO) Take 1 packet by mouth daily as needed. For headache    Historical Provider, MD  beclomethasone (QVAR) 80 MCG/ACT inhaler Inhale 1 puff into the lungs 2 (two) times daily. 09/29/14   Janne Napoleon, NP  escitalopram (LEXAPRO) 10 MG tablet Take 1 tablet (10 mg total) by mouth daily. 06/27/14   Lance Bosch, NP  guaiFENesin-codeine (ROBITUSSIN AC) 100-10 MG/5ML syrup Take 5 mLs by mouth 3 (three) times daily as needed for cough. 09/14/14   Harden Mo, MD  ibuprofen (ADVIL,MOTRIN) 200 MG tablet Take 800 mg by mouth every 8 (eight) hours as needed. For pain    Historical Provider, MD  polyethylene glycol (MIRALAX / GLYCOLAX) packet Take 17 g by mouth daily. 06/02/14   Malvin Johns, MD  predniSONE (DELTASONE) 20 MG tablet Take 1 tablet (20 mg total) by mouth 2 (two) times daily. 09/14/14   Harden Mo, MD  valACYclovir (VALTREX) 1000 MG tablet Take 1 tablet (1,000 mg total) by mouth daily. 06/27/14   Lance Bosch, NP   BP 116/87 mmHg  Pulse 105  Temp(Src) 98 F (36.7 C) (Oral)  Resp 18  SpO2 98%  LMP 10/04/2014 Physical Exam  Constitutional: She is oriented to person, place, and time. Vital signs are normal. She appears well-developed and well-nourished.  Non-toxic appearance. No distress.  Afebrile, nontoxic, well appearing, initially noted to have HR of 105 in triage which resolved during exam, HR 89-92 during exam. VSS.  HENT:  Head: Normocephalic and atraumatic.  Right Ear: Hearing, external ear and ear canal normal. Tympanic membrane is not erythematous, not retracted and not bulging. A middle ear effusion (serous) is present.  Left Ear: Hearing, external ear and ear canal normal. Tympanic membrane is not erythematous, not retracted and not bulging. A middle ear effusion (serous) is present.  Nose: Mucosal edema and rhinorrhea present. Right sinus exhibits maxillary sinus tenderness. Left sinus exhibits maxillary sinus tenderness.  Mouth/Throat: Uvula is midline, oropharynx is clear and moist and mucous membranes are normal. No trismus in the jaw. No uvula swelling.  B/l clear serous effusions, TMs nonerythematous without bulging or retraction B/l nasal turbinate edema and clear rhinorrhea, maxillary TTP bilaterally Oropharynx clear and moist without  uvular deviation or trismus, no tonsillar swelling or exudates  Eyes: Conjunctivae and EOM are normal. Pupils are equal, round, and reactive to light. Right eye exhibits no discharge. Left eye exhibits no discharge.  Neck: Normal range of motion. Neck supple.  Cardiovascular: Normal rate, regular rhythm, normal heart sounds and intact distal pulses.  Exam reveals no gallop and no friction rub.   No murmur heard. RRR, nl s1/s2, no m/r/g, distal pulses intact, no pedal edema  Pulmonary/Chest: Effort normal and breath sounds  normal. No respiratory distress. She has no decreased breath sounds. She has no wheezes. She has no rhonchi. She has no rales.  CTAB in all lung fields, no w/r/r  Abdominal: Soft. Normal appearance and bowel sounds are normal. She exhibits no distension. There is no tenderness. There is no rigidity, no rebound, no guarding, no tenderness at McBurney's point and negative Murphy's sign.  Musculoskeletal: Normal range of motion.  Steady gait, MAE x4  Lymphadenopathy:    She has no cervical adenopathy.  No head/neck LAD  Neurological: She is alert and oriented to person, place, and time. She has normal strength. No sensory deficit. Gait normal.  Skin: Skin is warm, dry and intact. No rash noted.  Psychiatric: She has a normal mood and affect.  Nursing note and vitals reviewed.   ED Course  Procedures (including critical care time) Labs Review Labs Reviewed - No data to display  Imaging Review Dg Chest 2 View  10/18/2014   CLINICAL DATA:  Chronic are and short of breath since October. Duration of symptoms are greater than 1 month.  EXAM: CHEST  2 VIEW  COMPARISON:  06/02/2014  FINDINGS: Normal heart size. Clear lungs. Lungs are under aerated. No pneumothorax or pleural effusion. Linear object projects over the left upper chest. It is likely external to the patient.  IMPRESSION: No active cardiopulmonary disease.   Electronically Signed   By: Maryclare Bean M.D.   On: 10/18/2014 13:26     EKG Interpretation None      MDM   Final diagnoses:  Post-viral cough syndrome  URI (upper respiratory infection)  Rhinorrhea  Nasal congestion    33 y.o. female with persistent vs recurrent cough. No formal diagnosis of asthma. Already treated with qvar, albuterol, and prednisone recently. Now with new URI symptoms. Pt requesting CXR for "peace of mind" therefore CXR ordered in triage. Doubt need for nebs today, doubt need for steroids. Likely could use claritin and flonase, her symptoms could be viral  vs allergic. Will reassess after CXR. Doubt PE, although her initial HR was recorded as 105, during exam her HR was 80s-90s, she has no risk factors, and she isn't endorsing CP/SOB.   1:55 PM CXR unremarkable. Discussed w/ pt that viral URIs can cause post-viral tussive syndrome, or if allergies are playing a part this may be contributing to her ongoing illness. Discussed use of allegra, flonase, and nasal saline. Discussed staying hydrated and getting rest. Will have her f/up with her PCP in 1 wk for ongoing management, but pt would like pulm f/up in order to formally diagnose if she has any asthma component. She has already had pred burst and doesn't want to do this again. I explained the diagnosis and have given explicit precautions to return to the ER including for any other new or worsening symptoms. The patient understands and accepts the medical plan as it's been dictated and I have answered their questions. Discharge instructions concerning home care and prescriptions have been  given. The patient is STABLE and is discharged to home in good condition.  BP 123/85 mmHg  Pulse 95  Temp(Src) 98 F (36.7 C) (Oral)  Resp 18  SpO2 100%  LMP 10/04/2014  Meds ordered this encounter  Medications  . fluticasone (FLONASE) 50 MCG/ACT nasal spray    Sig: Place 2 sprays into both nostrils daily.    Dispense:  16 g    Refill:  0    Order Specific Question:  Supervising Provider    Answer:  Johnna Acosta [address] [place], PA-C 10/18/14 1402  Johnna Acosta, MD 10/18/14 2015

## 2014-10-18 NOTE — ED Notes (Signed)
Pt dx with bronchitis a month ago; given numerous meds to help. Went back and MD gave steroid inhaler. Reports never got xray and worried it is pneumonia. Sinus pressure coughing up green mucous.

## 2014-10-24 ENCOUNTER — Encounter: Payer: Self-pay | Admitting: Internal Medicine

## 2014-10-24 ENCOUNTER — Ambulatory Visit: Payer: Medicaid Other | Attending: Internal Medicine | Admitting: Internal Medicine

## 2014-10-24 VITALS — BP 118/76 | HR 92 | Temp 98.0°F | Resp 16 | Ht 70.0 in | Wt 245.0 lb

## 2014-10-24 DIAGNOSIS — Z8249 Family history of ischemic heart disease and other diseases of the circulatory system: Secondary | ICD-10-CM | POA: Diagnosis not present

## 2014-10-24 DIAGNOSIS — Z833 Family history of diabetes mellitus: Secondary | ICD-10-CM | POA: Diagnosis not present

## 2014-10-24 DIAGNOSIS — Z87891 Personal history of nicotine dependence: Secondary | ICD-10-CM | POA: Insufficient documentation

## 2014-10-24 DIAGNOSIS — R05 Cough: Secondary | ICD-10-CM | POA: Diagnosis present

## 2014-10-24 DIAGNOSIS — E079 Disorder of thyroid, unspecified: Secondary | ICD-10-CM | POA: Insufficient documentation

## 2014-10-24 DIAGNOSIS — J069 Acute upper respiratory infection, unspecified: Secondary | ICD-10-CM | POA: Insufficient documentation

## 2014-10-24 DIAGNOSIS — Z79899 Other long term (current) drug therapy: Secondary | ICD-10-CM | POA: Diagnosis not present

## 2014-10-24 DIAGNOSIS — B009 Herpesviral infection, unspecified: Secondary | ICD-10-CM | POA: Insufficient documentation

## 2014-10-24 MED ORDER — AZITHROMYCIN 250 MG PO TABS: ORAL_TABLET | ORAL | Status: DC

## 2014-10-24 MED ORDER — METHYLPREDNISOLONE SODIUM SUCC 125 MG IJ SOLR
60.0000 mg | Freq: Once | INTRAMUSCULAR | Status: AC
  Administered 2014-10-24: 60 mg via INTRAMUSCULAR

## 2014-10-24 NOTE — Progress Notes (Signed)
Patient ID: Patricia Jenkins, female   DOB: [birth year], 33 y.o.   MRN: [id number]  CC: uri follow up  HPI:  Patricia Jenkins is a 33 y.o. female with a PMHx of herpes, PNA, and thyroid disease, who presents to clinic for follow up of c/o of recurrent cough. Patient reports that in October she was diagnosed with bronchitis and tonsillitis given antibiotics and her symptoms improved except for a cough which persisted, then she returned to the urgent care and was diagnosed with bronchospasm and given Qvar and albuterol which has been helping her symptoms and then last week she developed a "cold" which has caused her cough to worsen again. She reports a productive cough with green sputum, sinus pressure, yellow rhinorrhea, sore throat which she describes as dryness, and occasional sinus pressure headaches which she has used Sudafed and Alka-Seltzer with relief. She went back to the ER last week and was given Flonase which she states has not helped her.  She continues to use her inhalers which are helping her breathing. She states that occasionally she gets a tightness in her chest with coughing, but this is not ongoing at this time. Additionally she reports occasional nausea. She denies any known aggravating factors for her symptoms. She denies any fevers, chills, chest pain, shortness of breath, wheezing, on a pain/itching/drainage, ear pain/drainage, abdominal pain, vomiting, diarrhea, constipation, melena, hematochezia, urinary symptoms, vaginal symptoms, LE swelling, recent travel, sick contacts, or known allergies. She has never been diagnosed with asthma. She is no longer a smoker.   No Known Allergies Past Medical History  Diagnosis Date  . Herpes   . Pneumonia 2010  . Thyroid disease    Current Outpatient Prescriptions on File Prior to Visit  Medication Sig Dispense Refill  . fluticasone (FLONASE) 50 MCG/ACT nasal spray Place 2 sprays into both nostrils daily. 16 g 0  . valACYclovir (VALTREX) 1000 MG  tablet Take 1 tablet (1,000 mg total) by mouth daily. 30 tablet 3  . albuterol (PROVENTIL HFA;VENTOLIN HFA) 108 (90 BASE) MCG/ACT inhaler Inhale 2 puffs into the lungs 4 (four) times daily. (Patient not taking: Reported on 10/18/2014) 1 Inhaler 0  . amoxicillin (AMOXIL) 500 MG capsule Take 1 capsule (500 mg total) by mouth 3 (three) times daily. (Patient not taking: Reported on 10/18/2014) 30 capsule 0  . Aspirin-Salicylamide-Caffeine (BC HEADACHE POWDER PO) Take 1 packet by mouth daily as needed. For headache    . beclomethasone (QVAR) 80 MCG/ACT inhaler Inhale 1 puff into the lungs 2 (two) times daily. (Patient not taking: Reported on 10/18/2014) 1 Inhaler 0  . escitalopram (LEXAPRO) 10 MG tablet Take 1 tablet (10 mg total) by mouth daily. (Patient not taking: Reported on 10/24/2014) 30 tablet 4  . guaiFENesin-codeine (ROBITUSSIN AC) 100-10 MG/5ML syrup Take 5 mLs by mouth 3 (three) times daily as needed for cough. (Patient not taking: Reported on 10/18/2014) 120 mL 0  . ibuprofen (ADVIL,MOTRIN) 200 MG tablet Take 800 mg by mouth every 8 (eight) hours as needed. For pain    . polyethylene glycol (MIRALAX / GLYCOLAX) packet Take 17 g by mouth daily. (Patient not taking: Reported on 10/18/2014) 14 each 0  . predniSONE (DELTASONE) 20 MG tablet Take 1 tablet (20 mg total) by mouth 2 (two) times daily. (Patient not taking: Reported on 10/18/2014) 10 tablet 0   No current facility-administered medications on file prior to visit.   Family History  Problem Relation Age of Onset  . Hypertension Mother   .  Hypothyroidism Mother   . Diabetes Father   . Hypertension Father    History   Social History  . Marital Status: Single    Spouse Name: N/A    Number of Children: N/A  . Years of Education: N/A   Occupational History  . Not on file.   Social History Main Topics  . Smoking status: Former Research scientist (life sciences)  . Smokeless tobacco: Never Used  . Alcohol Use: Yes  . Drug Use: No  . Sexual Activity:     Partners: Male    Birth Control/ Protection: Surgical     Comment: Tubal Ligation    Other Topics Concern  . Not on file   Social History Narrative    Review of Systems: See HPI.    Objective:   Filed Vitals:   10/24/14 1703  BP: 118/76  Pulse: 92  Temp: 98 F (36.7 C)  Resp: 16    Physical Exam  Constitutional: She is oriented to person, place, and time.  HENT:  Right Ear: External ear normal.  Left Ear: External ear normal.  Mouth/Throat: Oropharynx is clear and moist.  Eyes: Conjunctivae and EOM are normal. Pupils are equal, round, and reactive to light. Right eye exhibits no discharge. Left eye exhibits no discharge.  Cardiovascular: Normal rate, regular rhythm and normal heart sounds.   Pulmonary/Chest: Effort normal.  Abdominal: Soft. Bowel sounds are normal.  Lymphadenopathy:    She has no cervical adenopathy.  Neurological: She is alert and oriented to person, place, and time.     Lab Results  Component Value Date   WBC 6.9 06/01/2014   HGB 11.5* 06/01/2014   HCT 33.9* 06/01/2014   MCV 82.1 06/01/2014   PLT 251 06/01/2014   Lab Results  Component Value Date   CREATININE 0.69 06/01/2014   BUN 7 06/01/2014   NA 140 06/01/2014   K 3.4* 06/01/2014   CL 101 06/01/2014   CO2 25 06/01/2014    Lab Results  Component Value Date   HGBA1C 5.7* 06/27/2014   Lipid Panel     Component Value Date/Time   CHOL 177 06/27/2014 1050   TRIG 142 06/27/2014 1050   HDL 47 06/27/2014 1050   CHOLHDL 3.8 06/27/2014 1050   VLDL 28 06/27/2014 1050   LDLCALC 102* 06/27/2014 1050       Assessment and plan:   Patricia Jenkins was seen today for follow-up.  Diagnoses and associated orders for this visit:  URI (upper respiratory infection) - methylPREDNISolone sodium succinate (SOLU-MEDROL) 125 mg/2 mL injection 60 mg; Inject 0.96 mLs (60 mg total) into the muscle once. - azithromycin (ZITHROMAX) 250 MG tablet; Take 2 today and 1 each day after   Return if symptoms  worsen or fail to improve.        Chari Manning, [facility] [phone] 10/24/2014, 5:44 PM

## 2014-10-24 NOTE — Patient Instructions (Signed)
Upper Respiratory Infection, Adult An upper respiratory infection (URI) is also sometimes known as the common cold. The upper respiratory tract includes the nose, sinuses, throat, trachea, and bronchi. Bronchi are the airways leading to the lungs. Most people improve within 1 week, but symptoms can last up to 2 weeks. A residual cough may last even longer.  CAUSES Many different viruses can infect the tissues lining the upper respiratory tract. The tissues become irritated and inflamed and often become very moist. Mucus production is also common. A cold is contagious. You can easily spread the virus to others by oral contact. This includes kissing, sharing a glass, coughing, or sneezing. Touching your mouth or nose and then touching a surface, which is then touched by another person, can also spread the virus. SYMPTOMS  Symptoms typically develop 1 to 3 days after you come in contact with a cold virus. Symptoms vary from person to person. They may include:  Runny nose.  Sneezing.  Nasal congestion.  Sinus irritation.  Sore throat.  Loss of voice (laryngitis).  Cough.  Fatigue.  Muscle aches.  Loss of appetite.  Headache.  Low-grade fever. DIAGNOSIS  You might diagnose your own cold based on familiar symptoms, since most people get a cold 2 to 3 times a year. Your caregiver can confirm this based on your exam. Most importantly, your caregiver can check that your symptoms are not due to another disease such as strep throat, sinusitis, pneumonia, asthma, or epiglottitis. Blood tests, throat tests, and X-rays are not necessary to diagnose a common cold, but they may sometimes be helpful in excluding other more serious diseases. Your caregiver will decide if any further tests are required. RISKS AND COMPLICATIONS  You may be at risk for a more severe case of the common cold if you smoke cigarettes, have chronic heart disease (such as heart failure) or lung disease (such as asthma), or if  you have a weakened immune system. The very young and very old are also at risk for more serious infections. Bacterial sinusitis, middle ear infections, and bacterial pneumonia can complicate the common cold. The common cold can worsen asthma and chronic obstructive pulmonary disease (COPD). Sometimes, these complications can require emergency medical care and may be life-threatening. PREVENTION  The best way to protect against getting a cold is to practice good hygiene. Avoid oral or hand contact with people with cold symptoms. Wash your hands often if contact occurs. There is no clear evidence that vitamin C, vitamin E, echinacea, or exercise reduces the chance of developing a cold. However, it is always recommended to get plenty of rest and practice good nutrition. TREATMENT  Treatment is directed at relieving symptoms. There is no cure. Antibiotics are not effective, because the infection is caused by a virus, not by bacteria. Treatment may include:  Increased fluid intake. Sports drinks offer valuable electrolytes, sugars, and fluids.  Breathing heated mist or steam (vaporizer or shower).  Eating chicken soup or other clear broths, and maintaining good nutrition.  Getting plenty of rest.  Using gargles or lozenges for comfort.  Controlling fevers with ibuprofen or acetaminophen as directed by your caregiver.  Increasing usage of your inhaler if you have asthma. Zinc gel and zinc lozenges, taken in the first 24 hours of the common cold, can shorten the duration and lessen the severity of symptoms. Pain medicines may help with fever, muscle aches, and throat pain. A variety of non-prescription medicines are available to treat congestion and runny nose. Your caregiver   can make recommendations and may suggest nasal or lung inhalers for other symptoms.  HOME CARE INSTRUCTIONS   Only take over-the-counter or prescription medicines for pain, discomfort, or fever as directed by your  caregiver.  Use a warm mist humidifier or inhale steam from a shower to increase air moisture. This may keep secretions moist and make it easier to breathe.  Drink enough water and fluids to keep your urine clear or pale yellow.  Rest as needed.  Return to work when your temperature has returned to normal or as your caregiver advises. You may need to stay home longer to avoid infecting others. You can also use a face mask and careful hand washing to prevent spread of the virus. SEEK MEDICAL CARE IF:   After the first few days, you feel you are getting worse rather than better.  You need your caregiver's advice about medicines to control symptoms.  You develop chills, worsening shortness of breath, or brown or red sputum. These may be signs of pneumonia.  You develop yellow or brown nasal discharge or pain in the face, especially when you bend forward. These may be signs of sinusitis.  You develop a fever, swollen neck glands, pain with swallowing, or white areas in the back of your throat. These may be signs of strep throat. SEEK IMMEDIATE MEDICAL CARE IF:   You have a fever.  You develop severe or persistent headache, ear pain, sinus pain, or chest pain.  You develop wheezing, a prolonged cough, cough up blood, or have a change in your usual mucus (if you have chronic lung disease).  You develop sore muscles or a stiff neck. Document Released: 04/30/2001 Document Revised: 01/27/2012 Document Reviewed: 02/09/2014 [employer] Patient Information 2015 [employer]. This information is not intended to replace advice given to you by your health care provider. Make sure you discuss any questions you have with your health care provider.  

## 2014-10-24 NOTE — Progress Notes (Signed)
Pt is here she has been in the ED and the urgent care twice with flu like symptoms that she has had for about 2 months.

## 2014-11-14 ENCOUNTER — Encounter: Payer: Self-pay | Admitting: *Deleted

## 2014-11-15 ENCOUNTER — Encounter: Payer: Self-pay | Admitting: Obstetrics & Gynecology

## 2014-11-28 ENCOUNTER — Other Ambulatory Visit: Payer: Self-pay | Admitting: Internal Medicine

## 2014-12-13 ENCOUNTER — Other Ambulatory Visit: Payer: Self-pay | Admitting: Emergency Medicine

## 2014-12-13 MED ORDER — ALBUTEROL SULFATE HFA 108 (90 BASE) MCG/ACT IN AERS: 2.0000 | INHALATION_SPRAY | Freq: Four times a day (QID) | RESPIRATORY_TRACT | Status: DC

## 2014-12-29 ENCOUNTER — Emergency Department (HOSPITAL_COMMUNITY)
Admission: EM | Admit: 2014-12-29 | Discharge: 2014-12-29 | Disposition: A | Discharge status: Home / Self Care | Payer: Medicaid Other | Attending: Emergency Medicine | Admitting: Emergency Medicine

## 2014-12-29 ENCOUNTER — Encounter (HOSPITAL_COMMUNITY): Payer: Self-pay | Admitting: Emergency Medicine

## 2014-12-29 ENCOUNTER — Emergency Department (HOSPITAL_COMMUNITY): Payer: Medicaid Other

## 2014-12-29 DIAGNOSIS — Z3202 Encounter for pregnancy test, result negative: Secondary | ICD-10-CM | POA: Insufficient documentation

## 2014-12-29 DIAGNOSIS — Z792 Long term (current) use of antibiotics: Secondary | ICD-10-CM | POA: Diagnosis not present

## 2014-12-29 DIAGNOSIS — Z8701 Personal history of pneumonia (recurrent): Secondary | ICD-10-CM | POA: Diagnosis not present

## 2014-12-29 DIAGNOSIS — Z87891 Personal history of nicotine dependence: Secondary | ICD-10-CM | POA: Diagnosis not present

## 2014-12-29 DIAGNOSIS — R079 Chest pain, unspecified: Secondary | ICD-10-CM | POA: Diagnosis not present

## 2014-12-29 DIAGNOSIS — Z8619 Personal history of other infectious and parasitic diseases: Secondary | ICD-10-CM | POA: Insufficient documentation

## 2014-12-29 DIAGNOSIS — Z79899 Other long term (current) drug therapy: Secondary | ICD-10-CM | POA: Diagnosis not present

## 2014-12-29 DIAGNOSIS — Z7952 Long term (current) use of systemic steroids: Secondary | ICD-10-CM | POA: Insufficient documentation

## 2014-12-29 DIAGNOSIS — Z7951 Long term (current) use of inhaled steroids: Secondary | ICD-10-CM | POA: Insufficient documentation

## 2014-12-29 LAB — BASIC METABOLIC PANEL
Anion gap: 7 (ref 5–15)
BUN: 6 mg/dL (ref 6–23)
CALCIUM: 8.3 mg/dL — AB (ref 8.4–10.5)
CO2: 24 mmol/L (ref 19–32)
CREATININE: 0.69 mg/dL (ref 0.50–1.10)
Chloride: 106 mmol/L (ref 96–112)
Glucose, Bld: 98 mg/dL (ref 70–99)
POTASSIUM: 3.3 mmol/L — AB (ref 3.5–5.1)
SODIUM: 137 mmol/L (ref 135–145)

## 2014-12-29 LAB — CBC WITH DIFFERENTIAL/PLATELET
Basophils Absolute: 0 10*3/uL (ref 0.0–0.1)
Basophils Relative: 0 % (ref 0–1)
EOS PCT: 1 % (ref 0–5)
Eosinophils Absolute: 0.1 10*3/uL (ref 0.0–0.7)
HEMATOCRIT: 32.3 % — AB (ref 36.0–46.0)
Hemoglobin: 10.9 g/dL — ABNORMAL LOW (ref 12.0–15.0)
Lymphocytes Relative: 25 % (ref 12–46)
Lymphs Abs: 1.5 10*3/uL (ref 0.7–4.0)
MCH: 26.9 pg (ref 26.0–34.0)
MCHC: 33.7 g/dL (ref 30.0–36.0)
MCV: 79.8 fL (ref 78.0–100.0)
Monocytes Absolute: 0.6 10*3/uL (ref 0.1–1.0)
Monocytes Relative: 10 % (ref 3–12)
Neutro Abs: 3.8 10*3/uL (ref 1.7–7.7)
Neutrophils Relative %: 64 % (ref 43–77)
PLATELETS: 269 10*3/uL (ref 150–400)
RBC: 4.05 MIL/uL (ref 3.87–5.11)
RDW: 13.4 % (ref 11.5–15.5)
WBC: 6 10*3/uL (ref 4.0–10.5)

## 2014-12-29 LAB — TROPONIN I: Troponin I: <0.03 ng/mL (ref <0.031)

## 2014-12-29 LAB — POC URINE PREG, ED: PREG TEST UR: NEGATIVE

## 2014-12-29 MED ORDER — HYDROCODONE-ACETAMINOPHEN 5-325 MG PO TABS: 1.0000 | ORAL_TABLET | ORAL | Status: DC | PRN

## 2014-12-29 MED ORDER — OXYCODONE-ACETAMINOPHEN 5-325 MG PO TABS
1.0000 | ORAL_TABLET | Freq: Once | ORAL | Status: AC
  Administered 2014-12-29: 1 via ORAL
  Filled 2014-12-29: qty 1

## 2014-12-29 NOTE — ED Notes (Signed)
Pt using restroom

## 2014-12-29 NOTE — ED Notes (Signed)
POCT urine preg negative per lab.  PA notified.

## 2014-12-29 NOTE — Discharge Instructions (Signed)
Your work-up was negative in the ED today. Follow-up with your primary care physician today. Return to the ED for new or worsening symptoms.

## 2014-12-29 NOTE — ED Notes (Signed)
Pt here from home with c/o chest pain that started yesterday , pt has had some nausea with the pain , no sob

## 2014-12-29 NOTE — ED Notes (Signed)
Pt states she has no pain sitting up like she is, but she does experience pain when she moves.

## 2014-12-29 NOTE — ED Notes (Signed)
Pt is stable upon d/c and ambulates with staff from ED. Pt verbalizes understanding rt d/c instructions and medications.

## 2014-12-29 NOTE — ED Notes (Signed)
Pt getting changed.  

## 2014-12-29 NOTE — ED Provider Notes (Signed)
CSN: [id number]     Arrival date & time 12/29/14  1006 History   First MD Initiated Contact with Patient 12/29/14 1039     Chief Complaint  Patient presents with  . Chest Pain     (Consider location/radiation/quality/duration/timing/severity/associated sxs/prior Treatment) Patient is a 34 y.o. female presenting with chest pain. The history is provided by the patient and medical records.  Chest Pain Associated symptoms: palpitations    This is a 34 y.o. F with PMH significant for genital herpes, presenting to the ED for chest pain. Patient states pain began yesterday and has been constant, localized to her mid-sternal region.  She denies radiation.  States pain is worse with movement.  She denies and associated SOB.  She does admit to some intermittent nausea and palpitations that only last a split second.  She denies dizziness or lightheadedness.  Patient has no prior cardiac hx.  Family hx of cardiac hx (grandmother and grandfather).  Patient is not a smoker.  No recent travel, surgeries, LE edema, or calf pain.  No hx of DVT or PE.  VSS on arrival.  Past Medical History  Diagnosis Date  . Herpes   . Pneumonia 2010   Past Surgical History  Procedure Laterality Date  . Dilation and curettage of uterus    . Tubal ligation    . Cholecystectomy  10/03/2011    Procedure: LAPAROSCOPIC CHOLECYSTECTOMY WITH INTRAOPERATIVE CHOLANGIOGRAM;  Surgeon: Harl Bowie, MD;  Location: [facility];  Service: General;  Laterality: N/A;   Family History  Problem Relation Age of Onset  . Hypertension Mother   . Hypothyroidism Mother   . Diabetes Father   . Hypertension Father    History  Substance Use Topics  . Smoking status: Former Research scientist (life sciences)  . Smokeless tobacco: Never Used  . Alcohol Use: Yes   OB History    Gravida Para Term Preterm AB TAB SAB Ectopic Multiple Living   4 3 3  1  1   3      Review of Systems  Cardiovascular: Positive for chest pain and palpitations.  All other systems  reviewed and are negative.     Allergies  Review of patient's allergies indicates no known allergies.  Home Medications   Prior to Admission medications   Medication Sig Start Date End Date Taking? Authorizing Provider  albuterol (PROVENTIL HFA;VENTOLIN HFA) 108 (90 BASE) MCG/ACT inhaler Inhale 2 puffs into the lungs 4 (four) times daily. 12/13/14   Lance Bosch, NP  amoxicillin (AMOXIL) 500 MG capsule Take 1 capsule (500 mg total) by mouth 3 (three) times daily. Patient not taking: Reported on 10/18/2014 09/14/14   Harden Mo, MD  Aspirin-Salicylamide-Caffeine [facility] HEADACHE POWDER PO) Take 1 packet by mouth daily as needed. For headache    Historical Provider, MD  azithromycin (ZITHROMAX) 250 MG tablet Take 2 today and 1 each day after 10/24/14   Lance Bosch, NP  beclomethasone (QVAR) 80 MCG/ACT inhaler Inhale 1 puff into the lungs 2 (two) times daily. Patient not taking: Reported on 10/18/2014 09/29/14   Janne Napoleon, NP  escitalopram (LEXAPRO) 10 MG tablet TAKE 1 TABLET BY MOUTH ONCE DAILY 12/02/14   Lance Bosch, NP  fluticasone (FLONASE) 50 MCG/ACT nasal spray Place 2 sprays into both nostrils daily. 10/18/14   Mercedes Strupp Camprubi-Soms, PA-C  guaiFENesin-codeine (ROBITUSSIN AC) 100-10 MG/5ML syrup Take 5 mLs by mouth 3 (three) times daily as needed for cough. Patient not taking: Reported on 10/18/2014 09/14/14   Shanon Brow  Collier Bullock, MD  ibuprofen (ADVIL,MOTRIN) 200 MG tablet Take 800 mg by mouth every 8 (eight) hours as needed. For pain    Historical Provider, MD  polyethylene glycol (MIRALAX / GLYCOLAX) packet Take 17 g by mouth daily. Patient not taking: Reported on 10/18/2014 06/02/14   Malvin Johns, MD  predniSONE (DELTASONE) 20 MG tablet Take 1 tablet (20 mg total) by mouth 2 (two) times daily. Patient not taking: Reported on 10/18/2014 09/14/14   Harden Mo, MD  valACYclovir (VALTREX) 1000 MG tablet Take 1 tablet (1,000 mg total) by mouth daily. 06/27/14   Lance Bosch, NP    BP 118/74 mmHg  Pulse 76  Temp(Src) 98.3 F (36.8 C) (Oral)  Resp 14  Ht 5\' 9"  (1.753 m)  SpO2 100%  LMP 12/28/2014   Physical Exam  Constitutional: She is oriented to person, place, and time. She appears well-developed and well-nourished. No distress.  HENT:  Head: Normocephalic and atraumatic.  Mouth/Throat: Oropharynx is clear and moist.  Eyes: Conjunctivae and EOM are normal. Pupils are equal, round, and reactive to light.  Neck: Normal range of motion. Neck supple.  Cardiovascular: Normal rate, regular rhythm and normal heart sounds.   Pulmonary/Chest: Effort normal and breath sounds normal. No respiratory distress. She has no wheezes.  Abdominal: Soft. Bowel sounds are normal. There is no tenderness. There is no guarding.  Musculoskeletal: Normal range of motion. She exhibits no edema.  No calf asymmetry, tenderness, or palpable cords  Neurological: She is alert and oriented to person, place, and time.  Skin: Skin is warm and dry. She is not diaphoretic.  Psychiatric: She has a normal mood and affect.  Nursing note and vitals reviewed.   ED Course  Procedures (including critical care time) Labs Review Labs Reviewed  CBC WITH DIFFERENTIAL/PLATELET - Abnormal; Notable for the following:    Hemoglobin 10.9 (*)    HCT 32.3 (*)    All other components within normal limits  BASIC METABOLIC PANEL - Abnormal; Notable for the following:    Potassium 3.3 (*)    Calcium 8.3 (*)    All other components within normal limits  TROPONIN I  POC URINE PREG, ED  Randolm Idol, ED    Imaging Review Dg Chest 2 View  12/29/2014   CLINICAL DATA:  34 year old female with mid chest pain for 2 days. Initial encounter.  EXAM: CHEST  2 VIEW  COMPARISON:  10/18/2014 and earlier.  FINDINGS: Improved lung volumes. Normal cardiac size and mediastinal contours. Visualized tracheal air column is within normal limits. Lung parenchyma stable and clear. No pneumothorax or pleural effusion.  Stable cholecystectomy clips. No osseous abnormality identified.  IMPRESSION: Negative, no acute cardiopulmonary abnormality.   Electronically Signed   By: Genevie Ann M.D.   On: 12/29/2014 12:30     EKG Interpretation None      MDM   Final diagnoses:  Chest pain   34 year old female with constant chest pain for the past 24 hours. She notes some associated nausea and split second palpitations. No shortness of breath. She has no known cardiac history. Her EKG is reassuring. Lab work was obtained which is reassuring, her anemia appears baseline to compare with previous values. Troponin negative. Chest x-ray is clear. Given 24 hours of constatny chest pain, and negative work-up in the ED today, i have low suspicion for ACS, PE, dissection, or other acute cardiac event.  Patient has HEART score of 0, PERC negative.  Patient will be d/c home with  close PCP FU.  Discussed plan with patient, he/she acknowledged understanding and agreed with plan of care.  Return precautions given for new or worsening symptoms.  Larene Pickett, PA-C 12/29/14 1548  Ernestina Patches, MD 12/29/14 [phone]

## 2015-01-21 ENCOUNTER — Encounter (HOSPITAL_COMMUNITY): Payer: Self-pay | Admitting: Emergency Medicine

## 2015-01-21 ENCOUNTER — Emergency Department (INDEPENDENT_AMBULATORY_CARE_PROVIDER_SITE_OTHER)
Admission: EM | Admit: 2015-01-21 | Discharge: 2015-01-21 | Disposition: A | Discharge status: Home / Self Care | Payer: Medicaid Other | Source: Home / Self Care | Attending: Family Medicine | Admitting: Family Medicine

## 2015-01-21 DIAGNOSIS — S86892A Other injury of other muscle(s) and tendon(s) at lower leg level, left leg, initial encounter: Secondary | ICD-10-CM

## 2015-01-21 DIAGNOSIS — M7052 Other bursitis of knee, left knee: Secondary | ICD-10-CM

## 2015-01-21 MED ORDER — MELOXICAM 15 MG PO TABS: 15.0000 mg | ORAL_TABLET | Freq: Every day | ORAL | Status: DC

## 2015-01-21 NOTE — Discharge Instructions (Signed)
Patricia Jenkins Cyst Patricia Jenkins cyst is Patricia sac-like structure that forms in the back of the knee. It is filled with the same fluid that is located in your knee. This fluid lubricates the bones and cartilage of the knee and allows them to move over each other more easily. CAUSES  When the knee becomes injured or inflamed, increased fluid forms in the knee. When this happens, the joint lining is pushed out behind the knee and forms the Patricia Jenkins cyst. This cyst may also be caused by inflammation from arthritic conditions and infections. SIGNS AND SYMPTOMS  Patricia Patricia Jenkins cyst usually has no symptoms. When the cyst is substantially enlarged:  You may feel pressure behind the knee, stiffness in the knee, or Patricia mass in the area behind the knee.  You may develop pain, redness, and swelling in the calf. This can suggest Patricia blood clot and requires evaluation by your health care provider. DIAGNOSIS  Patricia Patricia Jenkins cyst is most often found during an ultrasound exam. This exam may have been performed for other reasons, and the cyst was found incidentally. Sometimes an MRI is used. This picks up other problems within Patricia joint that an ultrasound exam may not. If the Patricia Jenkins cyst developed immediately after an injury, X-ray exams may be used to diagnose the cyst. TREATMENT  The treatment depends on the cause of the cyst. Anti-inflammatory medicines and rest often will be prescribed. If the cyst is caused by Patricia bacterial infection, antibiotic medicines may be prescribed.  HOME CARE INSTRUCTIONS   If the cyst was caused by an injury, for the first 24 hours, keep the injured leg elevated on 2 pillows while lying down.  For the first 24 hours while you are awake, apply ice to the injured area:  Put ice in Patricia plastic bag.  Place Patricia towel between your skin and the bag.  Leave the ice on for 20 minutes, 2-3 times Patricia day.  Only take over-the-counter or prescription medicines for pain, discomfort, or fever as directed by your health care  provider.  Only take antibiotic medicine as directed. Make sure to finish it even if you start to feel better. MAKE SURE YOU:   Understand these instructions.  Will watch your condition.  Will get help right away if you are not doing well or get worse. Document Released: 11/04/2005 Document Revised: 08/25/2013 Document Reviewed: 06/16/2013 [facility] Patient Information 2015 [place], [place]. This information is not intended to replace advice given to you by your health care provider. Make sure you discuss any questions you have with your health care provider.  Bursitis Bursitis is Patricia swelling and soreness (inflammation) of Patricia fluid-filled sac (bursa) that overlies and protects Patricia joint. It can be caused by injury, overuse of the joint, arthritis or infection. The joints most likely to be affected are the elbows, shoulders, hips and knees. HOME CARE INSTRUCTIONS   Apply ice to the affected area for 15-20 minutes each hour while awake for 2 days. Put the ice in Patricia plastic bag and place Patricia towel between the bag of ice and your skin.  Rest the injured joint as much as possible, but continue to put the joint through Patricia full range of motion, 4 times per day. (The shoulder joint especially becomes rapidly "frozen" if not used.) When the pain lessens, begin normal slow movements and usual activities.  Only take over-the-counter or prescription medicines for pain, discomfort or fever as directed by your caregiver.  Your caregiver may recommend draining the bursa  and injecting medicine into the bursa. This may help the healing process.  Follow all instructions for follow-up with your caregiver. This includes any orthopedic referrals, physical therapy and rehabilitation. Any delay in obtaining necessary care could result in Patricia delay or failure of the bursitis to heal and chronic pain. SEEK IMMEDIATE MEDICAL CARE IF:   Your pain increases even during treatment.  You develop an oral temperature above 102 F  (38.9 C) and have heat and inflammation over the involved bursa. MAKE SURE YOU:   Understand these instructions.  Will watch your condition.  Will get help right away if you are not doing well or get worse. Document Released: 11/01/2000 Document Revised: 01/27/2012 Document Reviewed: 01/24/2014 [facility] Patient Information 2015 [place], [place]. This information is not intended to replace advice given to you by your health care provider. Make sure you discuss any questions you have with your health care provider.

## 2015-01-21 NOTE — ED Provider Notes (Signed)
CSN: [id number]     Arrival date & time 01/21/15  1008 History   First MD Initiated Contact with Patient 01/21/15 1040     Chief Complaint  Patient presents with  . Leg Injury   (Consider location/radiation/quality/duration/timing/severity/associated sxs/prior Treatment) HPI        34 year old female presents complaining of left leg pain. She recently started working out at [employer] and since then she has began to develop pain in her left leg. The pain is in the anterior medial portion of the shin. She also has pain and swelling in the back of her knee. She denies any specific injury. Pain is gradually increase over the past few days. She also admits to occasional popping in her knees. No swelling of the anterior portion of the knee. No swelling of the leg below the knee. No numbness or weakness.  Past Medical History  Diagnosis Date  . Herpes   . Pneumonia 2010   Past Surgical History  Procedure Laterality Date  . Dilation and curettage of uterus    . Tubal ligation    . Cholecystectomy  10/03/2011    Procedure: LAPAROSCOPIC CHOLECYSTECTOMY WITH INTRAOPERATIVE CHOLANGIOGRAM;  Surgeon: Harl Bowie, MD;  Location: [place];  Service: General;  Laterality: N/A;   Family History  Problem Relation Age of Onset  . Hypertension Mother   . Hypothyroidism Mother   . Diabetes Father   . Hypertension Father    History  Substance Use Topics  . Smoking status: Former Research scientist (life sciences)  . Smokeless tobacco: Never Used  . Alcohol Use: Yes   OB History    Gravida Para Term Preterm AB TAB SAB Ectopic Multiple Living   4 3 3  1  1   3      Review of Systems  Constitutional: Negative for fever and chills.  Respiratory: Negative for shortness of breath.   Cardiovascular: Negative for leg swelling.  Gastrointestinal: Negative for nausea, vomiting and diarrhea.  Musculoskeletal: Positive for myalgias and arthralgias.  Skin: Negative for rash.  All other systems reviewed and are  negative.   Allergies  Review of patient's allergies indicates no known allergies.  Home Medications   Prior to Admission medications   Medication Sig Start Date End Date Taking? Authorizing Provider  albuterol (PROVENTIL HFA;VENTOLIN HFA) 108 (90 BASE) MCG/ACT inhaler Inhale 2 puffs into the lungs 4 (four) times daily. Patient not taking: Reported on 12/29/2014 12/13/14   Lance Bosch, NP  amoxicillin (AMOXIL) 500 MG capsule Take 1 capsule (500 mg total) by mouth 3 (three) times daily. Patient not taking: Reported on 10/18/2014 09/14/14   Harden Mo, MD  Aspirin-Salicylamide-Caffeine [facility] HEADACHE POWDER PO) Take 1 packet by mouth daily as needed. For headache    Historical Provider, MD  azithromycin (ZITHROMAX) 250 MG tablet Take 2 today and 1 each day after 10/24/14   Lance Bosch, NP  beclomethasone (QVAR) 80 MCG/ACT inhaler Inhale 1 puff into the lungs 2 (two) times daily. Patient not taking: Reported on 10/18/2014 09/29/14   Janne Napoleon, NP  escitalopram (LEXAPRO) 10 MG tablet TAKE 1 TABLET BY MOUTH ONCE DAILY 12/02/14   Lance Bosch, NP  fluticasone (FLONASE) 50 MCG/ACT nasal spray Place 2 sprays into both nostrils daily. Patient not taking: Reported on 12/29/2014 10/18/14   Patty Sermons Camprubi-Soms, PA-C  guaiFENesin-codeine (ROBITUSSIN AC) 100-10 MG/5ML syrup Take 5 mLs by mouth 3 (three) times daily as needed for cough. Patient not taking: Reported on 10/18/2014 09/14/14  Harden Mo, MD  HYDROcodone-acetaminophen (NORCO/VICODIN) 5-325 MG per tablet Take 1 tablet by mouth every 4 (four) hours as needed. 12/29/14   Larene Pickett, PA-C  ibuprofen (ADVIL,MOTRIN) 200 MG tablet Take 800 mg by mouth every 8 (eight) hours as needed. For pain    Historical Provider, MD  meloxicam (MOBIC) 15 MG tablet Take 1 tablet (15 mg total) by mouth daily. 01/21/15   Freeman Caldron Ein Rijo, PA-C  polyethylene glycol (MIRALAX / GLYCOLAX) packet Take 17 g by mouth daily. Patient not taking: Reported on  10/18/2014 06/02/14   Malvin Johns, MD  predniSONE (DELTASONE) 20 MG tablet Take 1 tablet (20 mg total) by mouth 2 (two) times daily. Patient not taking: Reported on 10/18/2014 09/14/14   Harden Mo, MD  valACYclovir (VALTREX) 1000 MG tablet Take 1 tablet (1,000 mg total) by mouth daily. Patient not taking: Reported on 12/29/2014 06/27/14   Lance Bosch, NP   BP 120/72 mmHg  Pulse 86  Temp(Src) 98.4 F (36.9 C) (Oral)  Resp 16  SpO2 100%  LMP 12/28/2014 Physical Exam  Constitutional: She is oriented to person, place, and time. Vital signs are normal. She appears well-developed and well-nourished. No distress.  HENT:  Head: Normocephalic and atraumatic.  Cardiovascular:  Pulses:      Dorsalis pedis pulses are 2+ on the left side.  Pulmonary/Chest: Effort normal. No respiratory distress.  Musculoskeletal:       Left knee: She exhibits swelling (popliteal bursitis behind knee).       Left lower leg: She exhibits tenderness (tenerness of medial posterior tibia ).  Neurological: She is alert and oriented to person, place, and time. She has normal strength. Coordination normal.  Skin: Skin is warm and dry. No rash noted. She is not diaphoretic.  Psychiatric: She has a normal mood and affect. Judgment normal.  Nursing note and vitals reviewed.   ED Course  Procedures (including critical care time) Labs Review Labs Reviewed - No data to display  Imaging Review No results found.   MDM   1. Medial tibial stress syndrome, left, initial encounter   2. Popliteal bursitis, left    Treat with rest and daily mobic.  F/u with ortho if no improvement in a few days   Meds ordered this encounter  Medications  . meloxicam (MOBIC) 15 MG tablet    Sig: Take 1 tablet (15 mg total) by mouth daily.    Dispense:  30 tablet    Refill:  [place] Archie Atilano, PA-C 01/21/15 1130

## 2015-01-21 NOTE — ED Notes (Signed)
Patient reports she was working out at [employer] and hurt her left leg x 3 days ago. Able to ambulate. Patient is limping into exam room during triage. She has tried rest, elevation and ice with no real relief. Patient is in NAD.

## 2015-02-10 ENCOUNTER — Encounter: Payer: Self-pay | Admitting: Internal Medicine

## 2015-02-10 ENCOUNTER — Ambulatory Visit: Payer: Medicaid Other | Attending: Internal Medicine | Admitting: Internal Medicine

## 2015-02-10 VITALS — BP 127/87 | HR 105 | Temp 98.7°F | Resp 16 | Ht 70.0 in | Wt 238.0 lb

## 2015-02-10 DIAGNOSIS — M76812 Anterior tibial syndrome, left leg: Secondary | ICD-10-CM | POA: Diagnosis not present

## 2015-02-10 DIAGNOSIS — S86892D Other injury of other muscle(s) and tendon(s) at lower leg level, left leg, subsequent encounter: Secondary | ICD-10-CM

## 2015-02-10 DIAGNOSIS — M7052 Other bursitis of knee, left knee: Secondary | ICD-10-CM | POA: Diagnosis not present

## 2015-02-10 MED ORDER — TRAMADOL HCL 50 MG PO TABS: 50.0000 mg | ORAL_TABLET | Freq: Three times a day (TID) | ORAL | Status: DC | PRN

## 2015-02-10 NOTE — Patient Instructions (Signed)
Baker Cyst °A Baker cyst is a sac-like structure that forms in the back of the knee. It is filled with the same fluid that is located in your knee. This fluid lubricates the bones and cartilage of the knee and allows them to move over each other more easily. °CAUSES  °When the knee becomes injured or inflamed, increased fluid forms in the knee. When this happens, the joint lining is pushed out behind the knee and forms the Baker cyst. This cyst may also be caused by inflammation from arthritic conditions and infections. °SIGNS AND SYMPTOMS  °A Baker cyst usually has no symptoms. When the cyst is substantially enlarged: °· You may feel pressure behind the knee, stiffness in the knee, or a mass in the area behind the knee. °· You may develop pain, redness, and swelling in the calf.  This can suggest a blood clot and requires evaluation by your health care provider. °DIAGNOSIS  °A Baker cyst is most often found during an ultrasound exam. This exam may have been performed for other reasons, and the cyst was found incidentally. Sometimes an MRI is used. This picks up other problems within a joint that an ultrasound exam may not. If the Baker cyst developed immediately after an injury, X-ray exams may be used to diagnose the cyst. °TREATMENT  °The treatment depends on the cause of the cyst. Anti-inflammatory medicines and rest often will be prescribed. If the cyst is caused by a bacterial infection, antibiotic medicines may be prescribed.  °HOME CARE INSTRUCTIONS  °· If the cyst was caused by an injury, for the first 24 hours, keep the injured leg elevated on 2 pillows while lying down. °· For the first 24 hours while you are awake, apply ice to the injured area: °¨ Put ice in a plastic bag. °¨ Place a towel between your skin and the bag. °¨ Leave the ice on for 20 minutes, 2-3 times a day. °· Only take over-the-counter or prescription medicines for pain, discomfort, or fever as directed by your health care  provider. °· Only take antibiotic medicine as directed. Make sure to finish it even if you start to feel better. °MAKE SURE YOU:  °· Understand these instructions. °· Will watch your condition. °· Will get help right away if you are not doing well or get worse. °Document Released: 11/04/2005 Document Revised: 08/25/2013 Document Reviewed: 06/16/2013 °ExitCare® Patient Information ©2015 [employer]. This information is not intended to replace advice given to you by your health care provider. Make sure you discuss any questions you have with your health care provider. ° °

## 2015-02-10 NOTE — Progress Notes (Signed)
Pt is here here today b/c she strained her left leg 3 weeks ago and is still having pain. Pt states that she has a cyst on the back of her left knee.

## 2015-02-10 NOTE — Progress Notes (Signed)
Patient ID: Patricia Jenkins, female   DOB: Mar 12, 1981, 34 y.o.   MRN: [id number]  CC: leg strain  HPI: Patricia Jenkins is a 34 y.o. female here today for a follow up visit.  Patient has past medical history of depression and asthma.  Patient reports that she strained her left leg 3 weeks ago. She was exercising and feels like she put to much stress on her leg. She reports that she was diagnosed with a bakers cyst one year ago but never had treatment for it. She was evaluated in the urgent care earlier this month for the same complaint and has found to have a bakers cyst and medial tibial stress syndrome. She is requesting a referral to orthopedics.   Patient has No headache, No chest pain, No abdominal pain - No Nausea, No new weakness tingling or numbness, No Cough - SOB.  No Known Allergies Past Medical History  Diagnosis Date  . Herpes   . Pneumonia 2010   Current Outpatient Prescriptions on File Prior to Visit  Medication Sig Dispense Refill  . escitalopram (LEXAPRO) 10 MG tablet TAKE 1 TABLET BY MOUTH ONCE DAILY 30 tablet 4  . meloxicam (MOBIC) 15 MG tablet Take 1 tablet (15 mg total) by mouth daily. 30 tablet 5  . albuterol (PROVENTIL HFA;VENTOLIN HFA) 108 (90 BASE) MCG/ACT inhaler Inhale 2 puffs into the lungs 4 (four) times daily. (Patient not taking: Reported on 12/29/2014) 1 Inhaler 1  . amoxicillin (AMOXIL) 500 MG capsule Take 1 capsule (500 mg total) by mouth 3 (three) times daily. (Patient not taking: Reported on 10/18/2014) 30 capsule 0  . Aspirin-Salicylamide-Caffeine (BC HEADACHE POWDER PO) Take 1 packet by mouth daily as needed. For headache    . azithromycin (ZITHROMAX) 250 MG tablet Take 2 today and 1 each day after (Patient not taking: Reported on 02/10/2015) 6 each 0  . beclomethasone (QVAR) 80 MCG/ACT inhaler Inhale 1 puff into the lungs 2 (two) times daily. (Patient not taking: Reported on 10/18/2014) 1 Inhaler 0  . fluticasone (FLONASE) 50 MCG/ACT nasal spray Place 2 sprays into  both nostrils daily. (Patient not taking: Reported on 12/29/2014) 16 g 0  . guaiFENesin-codeine (ROBITUSSIN AC) 100-10 MG/5ML syrup Take 5 mLs by mouth 3 (three) times daily as needed for cough. (Patient not taking: Reported on 10/18/2014) 120 mL 0  . HYDROcodone-acetaminophen (NORCO/VICODIN) 5-325 MG per tablet Take 1 tablet by mouth every 4 (four) hours as needed. (Patient not taking: Reported on 02/10/2015) 10 tablet 0  . ibuprofen (ADVIL,MOTRIN) 200 MG tablet Take 800 mg by mouth every 8 (eight) hours as needed. For pain    . polyethylene glycol (MIRALAX / GLYCOLAX) packet Take 17 g by mouth daily. (Patient not taking: Reported on 10/18/2014) 14 each 0  . predniSONE (DELTASONE) 20 MG tablet Take 1 tablet (20 mg total) by mouth 2 (two) times daily. (Patient not taking: Reported on 10/18/2014) 10 tablet 0  . valACYclovir (VALTREX) 1000 MG tablet Take 1 tablet (1,000 mg total) by mouth daily. (Patient not taking: Reported on 12/29/2014) 30 tablet 3   No current facility-administered medications on file prior to visit.   Family History  Problem Relation Age of Onset  . Hypertension Mother   . Hypothyroidism Mother   . Diabetes Father   . Hypertension Father    History   Social History  . Marital Status: Single    Spouse Name: N/A  . Number of Children: N/A  . Years of Education: N/A  Occupational History  . Not on file.   Social History Main Topics  . Smoking status: Former Research scientist (life sciences)  . Smokeless tobacco: Never Used  . Alcohol Use: Yes  . Drug Use: No  . Sexual Activity:    Partners: Male    Birth Control/ Protection: Surgical     Comment: Tubal Ligation    Other Topics Concern  . Not on file   Social History Narrative    Review of Systems: See HPI    Objective:   Filed Vitals:   02/10/15 1056  BP: 127/87  Pulse: 105  Temp: 98.7 F (37.1 C)  Resp: 16   Physical Exam  Constitutional: She is oriented to person, place, and time. Vital signs are normal. She appears  well-developed and well-nourished. No distress.  HENT:  Head: Normocephalic and atraumatic.  Cardiovascular:  Pulses:  Dorsalis pedis pulses are 2+ on the left side.  Pulmonary/Chest: Effort normal. No respiratory distress.  Musculoskeletal:   Left knee: She exhibits swelling (popliteal bursitis behind knee).   Left lower leg: She exhibits tenderness (tenerness of medial posterior tibia ).  Neurological: She is alert and oriented to person, place, and time. She has normal strength. Coordination normal.  Skin: Skin is warm and dry. No rash noted. She is not diaphoretic.  Psychiatric: She has a normal mood and affect. Judgment normal.   Lab Results  Component Value Date   WBC 6.0 12/29/2014   HGB 10.9* 12/29/2014   HCT 32.3* 12/29/2014   MCV 79.8 12/29/2014   PLT 269 12/29/2014   Lab Results  Component Value Date   CREATININE 0.69 12/29/2014   BUN 6 12/29/2014   NA 137 12/29/2014   K 3.3* 12/29/2014   CL 106 12/29/2014   CO2 24 12/29/2014    Lab Results  Component Value Date   HGBA1C 5.7* 06/27/2014   Lipid Panel     Component Value Date/Time   CHOL 177 06/27/2014 1050   TRIG 142 06/27/2014 1050   HDL 47 06/27/2014 1050   CHOLHDL 3.8 06/27/2014 1050   VLDL 28 06/27/2014 1050   LDLCALC 102* 06/27/2014 1050       Assessment and plan:   Patricia Jenkins was seen today for follow-up.  Diagnoses and all orders for this visit:  Popliteal bursitis of left knee Orders: -     Ambulatory referral to Orthopedic Surgery  Medial tibial stress syndrome, left, subsequent encounter Orders: -     traMADol (ULTRAM) 50 MG tablet; Take 1 tablet (50 mg total) by mouth every 8 (eight) hours as needed.   Return if symptoms worsen or fail to improve.        Chari Manning, [facility] [phone] 02/10/2015, 11:17 AM

## 2015-03-02 ENCOUNTER — Ambulatory Visit
Admission: RE | Admit: 2015-03-02 | Discharge: 2015-03-02 | Disposition: A | Discharge status: Home / Self Care | Payer: Medicaid Other | Source: Ambulatory Visit | Attending: Sports Medicine | Admitting: Sports Medicine

## 2015-03-02 ENCOUNTER — Encounter: Payer: Self-pay | Admitting: Sports Medicine

## 2015-03-02 ENCOUNTER — Other Ambulatory Visit: Payer: Self-pay | Admitting: Sports Medicine

## 2015-03-02 ENCOUNTER — Ambulatory Visit (INDEPENDENT_AMBULATORY_CARE_PROVIDER_SITE_OTHER): Payer: Medicaid Other | Admitting: Sports Medicine

## 2015-03-02 VITALS — BP 121/76 | HR 91 | Ht 71.0 in | Wt 240.0 lb

## 2015-03-02 DIAGNOSIS — M25561 Pain in right knee: Secondary | ICD-10-CM

## 2015-03-02 DIAGNOSIS — M76899 Other specified enthesopathies of unspecified lower limb, excluding foot: Secondary | ICD-10-CM

## 2015-03-02 DIAGNOSIS — M769 Unspecified enthesopathy, lower limb, excluding foot: Secondary | ICD-10-CM | POA: Diagnosis not present

## 2015-03-02 DIAGNOSIS — M25562 Pain in left knee: Secondary | ICD-10-CM

## 2015-03-02 NOTE — Progress Notes (Signed)
  BRYNLYNN Jenkins - 34 y.o. female MRN [id number]  Date of birth: Mar 04, 1981  SUBJECTIVE:  Including CC & ROS.  Issue is a pleasant [age]-year-old African-American female presented today for bilateral knee pain. Patient reports pain has been present for about 2 months now. She reports some occasional popping and catching sensation however she localizes her pain to her quadriceps insertion to the patella. She also complains of some swelling in the posterior aspect of her knee and tightness. Describes the pain as a dull ache as well. No injury or trauma associated. She stays active in the gym with biking and aerobic exercise. She reports she had to discontinue treadmill walking because of pain. History of playing sports as a child. Currently she's been taking tramadol for pain control and meloxicam which were provided by her PCP.   ROS: Review of systems otherwise negative except for information present in HPI  HISTORY: Past Medical, Surgical, Social, and Family History Reviewed & Updated per EMR. Pertinent Historical Findings include: Dysthymia currently on Lexapro History of cholecystectomy Family history of arthritis, DM, HTN, CAD  DATA REVIEWED: No imaging available  PHYSICAL EXAM:  VS: BP:121/76 mmHg  HR:91bpm  TEMP: ( )  RESP:   HT:5\' 11"  (180.3 cm)   WT:240 lb (108.863 kg)  BMI:33.5 Bilateral KNEE EXAM:  General: well nourished, no acute distress Skin of LE: warm; dry, no rashes, lesions, ecchymosis or erythema. Vascular: Dorsal pedal pulses 2+ bilaterally Neurologically: Sensation to light touch lower extremities equal and intact  Normal to inspection with no erythema or effusion or obvious bony abnormalities. Palpation: no warmth or joint line tenderness mild tenderness along the quadriceps insertion to the patella. Range of motion: ROM normal in flexion and extension and lower leg rotation. Ligaments with solid consistent endpoints including ACL, PCL, LCL, MCL. Negative patella  apprehension and normal tracking Meniscal evaluation: Negative McMurray's test, negative thessaly's test, Normal gait. Hamstring and quadriceps strength is normal.  MSK [place]: Bilateral ultrasound exam revealed hypoechoic changes but no significant tears of the quadriceps insertion to the patella consistent with tendinopathy. Normal patella tendon bilaterally. Normal medial and lateral meniscus bilaterally. Normal small Baker cyst in the posterior aspect of both knees.  ASSESSMENT & PLAN: See problem based charting & AVS for pt instructions. Impression: Quadriceps tendinitis bilaterally Chondromalacia patella bilaterally Possible small medial meniscus tear of right knee.  Recommendations: Advised patient that based on her physical history and exam today majority her symptoms seem to be coming from quadriceps weakness and tendinopathy as well as some chondromalacia of the patella. Recommend continuing meloxicam for another 6 weeks to calm down inflammation and pain. Provided her with some home exercises to do at the gym. Recommended continuing biking and aerobic exercises for cardiovascular crosstraining. Follow-up with patient in 6 weeks to reassess. Were also obtaining x-rays today to assess for signs of degenerative changes. Do not suspect majority of pain is coming from degenerative changes but given the patient's weight there is possibility.

## 2015-03-09 ENCOUNTER — Other Ambulatory Visit: Payer: Self-pay | Admitting: Internal Medicine

## 2015-03-10 NOTE — Telephone Encounter (Signed)
Patient asking for refill on Tramadol-please advise

## 2015-03-17 ENCOUNTER — Encounter: Payer: Self-pay | Admitting: Internal Medicine

## 2015-03-17 ENCOUNTER — Other Ambulatory Visit: Payer: Self-pay | Admitting: Internal Medicine

## 2015-03-17 MED ORDER — TRAMADOL HCL 50 MG PO TABS: 50.0000 mg | ORAL_TABLET | Freq: Three times a day (TID) | ORAL | Status: DC | PRN

## 2015-03-17 NOTE — Progress Notes (Signed)
Patient ID: Patricia Jenkins, female   DOB: [birth year], 34 y.o.   MRN: [id number] Request for Tramadol was sent through refill request in Epic on 4/21. Was authorized on 03/13/15 while remote accessed into system. Prescription was never printed or given to patient. Resent prescription today and handed to patient.   Chari Manning 03/17/2015 5:58 PM

## 2015-04-13 ENCOUNTER — Ambulatory Visit (INDEPENDENT_AMBULATORY_CARE_PROVIDER_SITE_OTHER): Payer: Medicaid Other | Admitting: Sports Medicine

## 2015-04-13 ENCOUNTER — Encounter: Payer: Self-pay | Admitting: Sports Medicine

## 2015-04-13 VITALS — BP 126/84 | Ht 71.0 in | Wt 240.0 lb

## 2015-04-13 DIAGNOSIS — S83221D Peripheral tear of medial meniscus, current injury, right knee, subsequent encounter: Secondary | ICD-10-CM | POA: Diagnosis not present

## 2015-04-13 DIAGNOSIS — M25561 Pain in right knee: Secondary | ICD-10-CM

## 2015-04-13 DIAGNOSIS — S83221A Peripheral tear of medial meniscus, current injury, right knee, initial encounter: Secondary | ICD-10-CM | POA: Insufficient documentation

## 2015-04-13 NOTE — Progress Notes (Signed)
Patricia Jenkins - 34 y.o. female MRN [id number]  Date of birth: Jul 10, 1981  SUBJECTIVE:  Including CC & ROS.  The patient is a pleasant 34 year old female presenting for follow-up bilateral right greater than left knee pain. Patient is more focused on her right knee pain today which he describes as a painful popping sensation that causes a sharp pain with giving way and locking up. This has become more apparent over the last 4 weeks since the patient was last seen. She also describes some posterior swelling and tightness in the back of her knee. She denies any associated trauma or provoking injury. Pain seems to be worse with deep bending squatting or going up stairs. She has not been able to do the home exercise strengthening program that we discussed previously due to time however she has continued to bike for aerobic activity at the gym. Continues to use meloxicam and breakthrough pain control with tramadol as needed.   ROS: Review of systems otherwise negative except for information present in HPI  HISTORY: Past Medical, Surgical, Social, and Family History Reviewed & Updated per EMR. Pertinent Historical Findings include: Dysthymia currently on Lexapro History of cholecystectomy Family history of arthritis, DM, HTN, CAD  DATA REVIEWED: No imaging available  PHYSICAL EXAM:  VS: BP:126/84 mmHg  HR: bpm  TEMP: ( )  RESP:   HT:5\' 11"  (180.3 cm)   WT:240 lb (108.863 kg)  BMI:33.5 RIGHT KNEE EXAM:  General: well nourished, no acute distress Skin of LE: warm; dry, no rashes, lesions, ecchymosis or erythema. Vascular: Dorsal pedal pulses 2+ bilaterally Neurologically: Sensation to light touch lower extremities equal and intact  Normal to inspection with no erythema or effusion or obvious bony abnormalities. Palpation: no warmth No tenderness over the quad tendon insertion today. More prominent joint line tenderness on the medial aspect of the right knee. Range of motion: ROM normal in  flexion and extension and lower leg rotation. Ligaments with solid consistent endpoints including ACL, PCL, LCL, MCL. Negative patella apprehension Mild patella alta Meniscal evaluation: Positive McMurray's today for medial joint line pain, positive Thessaly's for medial joint line pain and catching.Normal gait. Hamstring and quadriceps strength is normal.  MSK [place]: Repeat exam of patient's medial joint line on ultrasound revealed no distinct meniscal tear but there is evidence of medial meniscus protrusion and tear or meniscal cyst formation. Patient continues have a small posterior Baker cyst.  ASSESSMENT & PLAN: See problem based charting & AVS for pt instructions. Impression: Right knee peripheral medial meniscus tear Chondromalacia patella with mild degenerative changes  Recommendations: Discuss with patient at length that based on her history of mechanical symptoms of catching, locking, and giving way of her right knee often with minimal exacerbating activity such as walking or squatting, I suspect concern for possible meniscus tear. Also contributing to this clinical diagnosis is a positive McMurray's and Thessaly's for concern of internal derangement. Patient's x-rays reveal no signs of degenerative joint disease that could be causing her pain. Further her ultrasound exam does reveal medial meniscus protrusion impaired meniscal cysts indicating possible meniscal pathology. Also contributing is a small joint effusion within the knee. At this point discussed with patient options of continuing conservative management with anti-inflammatory, continue strengthening of quad and hamstring and weight loss to minimize impact on the knee versus proceeding with MRI to further classify the degree of internal derangement. Given her clinical findings and the duration of patient's symptoms patient has chose to proceed with MRI to further classify suspected  meniscal pathology.   Will follow-up with patient  over the phone with these results and discuss further management options.

## 2015-04-18 ENCOUNTER — Other Ambulatory Visit: Payer: Medicaid Other

## 2015-04-20 ENCOUNTER — Other Ambulatory Visit: Payer: Self-pay | Admitting: Sports Medicine

## 2015-04-20 ENCOUNTER — Telehealth: Payer: Self-pay | Admitting: *Deleted

## 2015-04-20 DIAGNOSIS — S83221D Peripheral tear of medial meniscus, current injury, right knee, subsequent encounter: Secondary | ICD-10-CM

## 2015-04-20 MED ORDER — DICLOFENAC SODIUM 75 MG PO TBEC: 75.0000 mg | DELAYED_RELEASE_TABLET | Freq: Two times a day (BID) | ORAL | Status: DC

## 2015-04-20 NOTE — Telephone Encounter (Signed)
OK please tell patient that I recommend changing medication management to a stronger NSAID.  Tell her to STOP the [facility]  I sent in an Rx for DICLOFENAC  She can use the tramadol is needed but the Diclofenac will likely work better than the other two meds.

## 2015-04-24 ENCOUNTER — Ambulatory Visit
Admission: RE | Admit: 2015-04-24 | Discharge: 2015-04-24 | Disposition: A | Discharge status: Home / Self Care | Payer: Medicaid Other | Source: Ambulatory Visit | Attending: Sports Medicine | Admitting: Sports Medicine

## 2015-04-24 DIAGNOSIS — M25561 Pain in right knee: Secondary | ICD-10-CM

## 2015-04-25 ENCOUNTER — Telehealth: Payer: Self-pay | Admitting: Sports Medicine

## 2015-04-25 DIAGNOSIS — M1711 Unilateral primary osteoarthritis, right knee: Secondary | ICD-10-CM | POA: Insufficient documentation

## 2015-04-25 NOTE — Telephone Encounter (Signed)
Discussed MRI right knee results Medial compartment degenerative changes No meniscus tear  Discuss treatment options of injections  No surgical recommendations right now Patient will call to schedule f/u for further injection therapy if desired

## 2015-05-08 ENCOUNTER — Other Ambulatory Visit: Payer: Self-pay | Admitting: Internal Medicine

## 2015-05-12 ENCOUNTER — Telehealth: Payer: Self-pay | Admitting: Internal Medicine

## 2015-05-12 ENCOUNTER — Other Ambulatory Visit: Payer: Self-pay

## 2015-05-12 MED ORDER — ESCITALOPRAM OXALATE 10 MG PO TABS: 10.0000 mg | ORAL_TABLET | Freq: Every day | ORAL | Status: DC

## 2015-05-12 NOTE — Telephone Encounter (Signed)
Patient called requesting medication refill for escitalopram (LEXAPRO) 10 MG tablet. Please f/u with patient

## 2015-05-12 NOTE — Telephone Encounter (Signed)
Patient is calling in today to request a medication refill for Lexapro; patient is unsure if she needs to come in for a visit or if this can just be refilled; please f/u with patient

## 2015-05-12 NOTE — Telephone Encounter (Signed)
Meds have been called in

## 2015-05-18 ENCOUNTER — Ambulatory Visit: Payer: Medicaid Other | Admitting: Sports Medicine

## 2015-05-26 NOTE — Telephone Encounter (Signed)
Called patient and patient verified date of birth.  Patient aware that Lexapro was sent to the pharmacy and that she did not need to come in for an appointment to obtain that medication.  Patient verbalized understanding.

## 2015-06-09 ENCOUNTER — Telehealth: Payer: Self-pay | Admitting: Internal Medicine

## 2015-06-09 NOTE — Telephone Encounter (Signed)
Patient called to request a med refill for Tramadol, please f/u with pt. °

## 2015-06-13 NOTE — Telephone Encounter (Signed)
Patient called to request a med refill for Tramadol, please f/u with pt. °

## 2015-06-14 ENCOUNTER — Other Ambulatory Visit: Payer: Self-pay

## 2015-06-14 MED ORDER — TRAMADOL HCL 50 MG PO TABS: 50.0000 mg | ORAL_TABLET | Freq: Two times a day (BID) | ORAL | Status: DC | PRN

## 2015-06-14 NOTE — Telephone Encounter (Signed)
Patient called nurse, date of birth verified. Patient requesting tramadol for leg pain. Tramadol not found on medication list but is reported in office visit note on 02/10/15. Nurse will send message to provider for tramadol refill.

## 2015-06-14 NOTE — Telephone Encounter (Signed)
Patient called to request a med refill for Tramadol, please f/u with pt. °

## 2015-06-14 NOTE — Telephone Encounter (Signed)
Nurse called patient, patient verified date of birth. Patient aware of prescription for Tramadol at front office to be signed for. Patient voices understanding and has no further questions at this time.

## 2015-06-14 NOTE — Telephone Encounter (Signed)
May refill. 60 tablets to take BID

## 2015-07-03 ENCOUNTER — Encounter: Payer: Self-pay | Admitting: Internal Medicine

## 2015-07-03 ENCOUNTER — Ambulatory Visit: Payer: Medicaid Other | Attending: Internal Medicine | Admitting: Internal Medicine

## 2015-07-03 VITALS — BP 127/85 | HR 93 | Temp 98.4°F | Resp 18 | Ht 70.0 in | Wt 242.8 lb

## 2015-07-03 DIAGNOSIS — G44209 Tension-type headache, unspecified, not intractable: Secondary | ICD-10-CM | POA: Diagnosis not present

## 2015-07-03 DIAGNOSIS — N920 Excessive and frequent menstruation with regular cycle: Secondary | ICD-10-CM

## 2015-07-03 MED ORDER — CYCLOBENZAPRINE HCL 10 MG PO TABS: 10.0000 mg | ORAL_TABLET | Freq: Three times a day (TID) | ORAL | Status: DC | PRN

## 2015-07-03 MED ORDER — BUTALBITAL-APAP-CAFFEINE 50-325-40 MG PO TABS: 1.0000 | ORAL_TABLET | Freq: Four times a day (QID) | ORAL | Status: DC | PRN

## 2015-07-03 MED ORDER — CYCLOBENZAPRINE HCL 10 MG PO TABS
10.0000 mg | ORAL_TABLET | Freq: Three times a day (TID) | ORAL | Status: DC | PRN
Start: 2015-07-03 — End: 2015-07-03

## 2015-07-03 NOTE — Progress Notes (Signed)
Patient here for menstrual problems. Patient has been having abnormal clotting during periods. Patient reports last period on 06/16/15-06/20/15 she had blood clots expelled that were very large and is concerned.Patient has been experiencing blood clots for years but never to that extent. Patient has had her tubes tied. Patient also states she has had bad cramps during this period which she usually does not have. Patient reports having lots of headaches. Patient took imitrex years ago which helped.   Patient reports a headache today rated at an 8 located on the back side described as dull. Headache is constant and has reoccurred over the last 2 weeks. Patient has been taking excedrin migraine but it has not helped.  Patient has taken lexapro today. Patient needs a refill on lexapro.

## 2015-07-03 NOTE — Progress Notes (Signed)
Pt c/o cervical pain

## 2015-07-03 NOTE — Progress Notes (Signed)
Patient ID: Patricia Jenkins, female   DOB: 1981-09-09, 34 y.o.   MRN: [id number]  CC: irregular menses, headache  HPI: Patricia Jenkins is a 34 y.o. female here today for a follow up visit.  Patient has past medical history of herpes. Patient reports last period on 06/16/15-06/20/15 she had blood clots expelled that were very large and is concerned.Patient has been experiencing blood clots for years but never to that extent. Patient also states she had bad cramps during this period which she usually does not have. Been having more severe abdominal cramping over past few cycles. Has heavy cycles that are regular. Patient is not up to date on pap smear.   Patient reports having lots of headaches. Patient took imitrex years ago which helped. Headache described as a 8/10 located in the back of her head. Pain is dull and constant for the past 2 weeks. She has tried excedrin migraine which did not did not help her pain. No photophobia, nausea, vomiting.  No Known Allergies Past Medical History  Diagnosis Date  . Herpes   . Pneumonia 2010   Current Outpatient Prescriptions on File Prior to Visit  Medication Sig Dispense Refill  . diclofenac (VOLTAREN) 75 MG EC tablet Take 1 tablet (75 mg total) by mouth 2 (two) times daily. 60 tablet 1  . escitalopram (LEXAPRO) 10 MG tablet Take 1 tablet (10 mg total) by mouth daily. 30 tablet 2  . traMADol (ULTRAM) 50 MG tablet Take 1 tablet (50 mg total) by mouth 2 (two) times daily as needed. 60 tablet 0   No current facility-administered medications on file prior to visit.   Family History  Problem Relation Age of Onset  . Hypertension Mother   . Hypothyroidism Mother   . Diabetes Father   . Hypertension Father    Social History   Social History  . Marital Status: Single    Spouse Name: N/A  . Number of Children: N/A  . Years of Education: N/A   Occupational History  . Not on file.   Social History Main Topics  . Smoking status: Former Research scientist (life sciences)  . Smokeless  tobacco: Never Used  . Alcohol Use: Yes     Comment: occasionally  . Drug Use: No  . Sexual Activity:    Partners: Male    Birth Control/ Protection: Surgical     Comment: Tubal Ligation    Other Topics Concern  . Not on file   Social History Narrative    Review of Systems: Other than what is stated in HPI, all other systems are negative.   Objective:   Filed Vitals:   07/03/15 1718  BP: 127/85  Pulse: 93  Temp: 98.4 F (36.9 C)  Resp: 18   Physical Exam  Constitutional: She is oriented to person, place, and time.  Cardiovascular: Normal rate, regular rhythm and normal heart sounds.   Pulmonary/Chest: Effort normal and breath sounds normal.  Abdominal: Soft. Bowel sounds are normal. She exhibits no distension. There is no tenderness.  Neurological: She is alert and oriented to person, place, and time. No cranial nerve deficit.  Skin: Skin is warm and dry.  Psychiatric: She has a normal mood and affect.    Lab Results  Component Value Date   WBC 6.0 12/29/2014   HGB 10.9* 12/29/2014   HCT 32.3* 12/29/2014   MCV 79.8 12/29/2014   PLT 269 12/29/2014   Lab Results  Component Value Date   CREATININE 0.69 12/29/2014   BUN 6 12/29/2014  NA 137 12/29/2014   K 3.3* 12/29/2014   CL 106 12/29/2014   CO2 24 12/29/2014    Lab Results  Component Value Date   HGBA1C 5.7* 06/27/2014   Lipid Panel     Component Value Date/Time   CHOL 177 06/27/2014 1050   TRIG 142 06/27/2014 1050   HDL 47 06/27/2014 1050   CHOLHDL 3.8 06/27/2014 1050   VLDL 28 06/27/2014 1050   LDLCALC 102* 06/27/2014 1050       Assessment and plan:   Patricia Jenkins was seen today for menstrual problem.  Diagnoses and all orders for this visit:  Tension headache -    Begin butalbital-acetaminophen-caffeine (FIORICET) 50-325-40 MG per tablet; Take 1 tablet by mouth every 6 (six) hours as needed for headache.. -    Begin cyclobenzaprine (FLEXERIL) 10 MG tablet; Take 1 tablet (10 mg total) by mouth  3 (three) times daily as needed for muscle spasms. Address stress management. She will keep a headache diary and if she continues to have frequent headaches she will return and we may possibly begin preventative therapy.   Menorrhagia with regular cycle -     Ambulatory referral to Gynecology Will also need pap as well    Return if symptoms worsen or fail to improve.       Lance Bosch, [facility] [phone] 07/03/2015, 5:25 PM

## 2015-07-03 NOTE — Patient Instructions (Signed)

## 2015-07-05 ENCOUNTER — Other Ambulatory Visit: Payer: Self-pay | Admitting: General Practice

## 2015-07-05 DIAGNOSIS — N920 Excessive and frequent menstruation with regular cycle: Secondary | ICD-10-CM

## 2015-07-18 ENCOUNTER — Ambulatory Visit (HOSPITAL_COMMUNITY)
Admission: RE | Admit: 2015-07-18 | Discharge: 2015-07-18 | Disposition: A | Discharge status: Home / Self Care | Payer: Medicaid Other | Source: Ambulatory Visit | Attending: Obstetrics & Gynecology | Admitting: Obstetrics & Gynecology

## 2015-07-18 DIAGNOSIS — N920 Excessive and frequent menstruation with regular cycle: Secondary | ICD-10-CM

## 2015-07-18 DIAGNOSIS — N946 Dysmenorrhea, unspecified: Secondary | ICD-10-CM | POA: Insufficient documentation

## 2015-07-18 DIAGNOSIS — Z9851 Tubal ligation status: Secondary | ICD-10-CM | POA: Diagnosis not present

## 2015-07-18 DIAGNOSIS — D252 Subserosal leiomyoma of uterus: Secondary | ICD-10-CM | POA: Diagnosis not present

## 2015-07-18 DIAGNOSIS — N939 Abnormal uterine and vaginal bleeding, unspecified: Secondary | ICD-10-CM | POA: Insufficient documentation

## 2015-07-26 ENCOUNTER — Encounter: Payer: Self-pay | Admitting: Obstetrics and Gynecology

## 2015-07-26 ENCOUNTER — Other Ambulatory Visit (HOSPITAL_COMMUNITY)
Admission: RE | Admit: 2015-07-26 | Discharge: 2015-07-26 | Disposition: A | Discharge status: Home / Self Care | Payer: Medicaid Other | Source: Ambulatory Visit | Attending: Obstetrics and Gynecology | Admitting: Obstetrics and Gynecology

## 2015-07-26 ENCOUNTER — Ambulatory Visit (INDEPENDENT_AMBULATORY_CARE_PROVIDER_SITE_OTHER): Payer: Medicaid Other | Admitting: Obstetrics and Gynecology

## 2015-07-26 ENCOUNTER — Other Ambulatory Visit: Payer: Self-pay | Admitting: Internal Medicine

## 2015-07-26 VITALS — BP 124/77 | HR 99 | Temp 98.7°F | Ht 69.0 in | Wt 245.4 lb

## 2015-07-26 DIAGNOSIS — N939 Abnormal uterine and vaginal bleeding, unspecified: Secondary | ICD-10-CM | POA: Diagnosis present

## 2015-07-26 DIAGNOSIS — Z1151 Encounter for screening for human papillomavirus (HPV): Secondary | ICD-10-CM | POA: Insufficient documentation

## 2015-07-26 DIAGNOSIS — Z3202 Encounter for pregnancy test, result negative: Secondary | ICD-10-CM

## 2015-07-26 DIAGNOSIS — Z01419 Encounter for gynecological examination (general) (routine) without abnormal findings: Secondary | ICD-10-CM | POA: Insufficient documentation

## 2015-07-26 LAB — TSH: TSH: 1.638 u[IU]/mL (ref 0.350–4.500)

## 2015-07-26 LAB — CBC
HEMATOCRIT: 34.6 % — AB (ref 36.0–46.0)
Hemoglobin: 11.5 g/dL — ABNORMAL LOW (ref 12.0–15.0)
MCH: 26.9 pg (ref 26.0–34.0)
MCHC: 33.2 g/dL (ref 30.0–36.0)
MCV: 81 fL (ref 78.0–100.0)
MPV: 9.3 fL (ref 8.6–12.4)
PLATELETS: 303 10*3/uL (ref 150–400)
RBC: 4.27 MIL/uL (ref 3.87–5.11)
RDW: 15.4 % (ref 11.5–15.5)
WBC: 6.9 10*3/uL (ref 4.0–10.5)

## 2015-07-26 LAB — POCT PREGNANCY, URINE: Preg Test, Ur: NEGATIVE

## 2015-07-26 NOTE — Progress Notes (Signed)
CLINIC ENCOUNTER NOTE  History:  34 y.o. [id number] here today for AUB.  [id number]. Menarche age 43. No fam hx cancer. Regular periods every month lasting 5 days. Is s/p BTL. For past 4 years has had more bleeding during period, requiring tampons and pads. More recently even more bleeding. No spotting. Takes lexapro, tramadol prn, valtrex prn.   Hx HPV on pap a couple of years ago, unsure where, normal paps prior to that.   Past Medical History  Diagnosis Date  . Herpes   . Pneumonia 2010  . Chronic headaches     Past Surgical History  Procedure Laterality Date  . Dilation and curettage of uterus    . Tubal ligation    . Cholecystectomy  10/03/2011    Procedure: LAPAROSCOPIC CHOLECYSTECTOMY WITH INTRAOPERATIVE CHOLANGIOGRAM;  Surgeon: Harl Bowie, MD;  Location: [facility];  Service: General;  Laterality: N/A;    The following portions of the patient's history were reviewed and updated as appropriate: allergies, current medications, past family history, past medical history, past social history, past surgical history and problem list.    Review of Systems:  See above; comprehensive review of systems was otherwise negative.  Objective:  Physical Exam BP 124/77 mmHg  Pulse 99  Temp(Src) 98.7 F (37.1 C)  Ht 5\' 9"  (1.753 m)  Wt 245 lb 6.4 oz (111.313 kg)  BMI 36.22 kg/m2  LMP 07/21/2015 CONSTITUTIONAL: Well-developed, well-nourished female in no acute distress.  HENT:  Normocephalic, atraumatic SKIN: Skin is warm and dry.  [place]: Alert  PSYCHIATRIC: Normal mood and affect.  CARDIOVASCULAR: Normal heart rate noted RESPIRATORY: Effort and breath sounds normal, no problems with respiration noted ABDOMEN: Soft, no distention noted.  No tenderness, rebound or guarding.  PELVIC: normal cervix, no bleeding, no cmt, no lesions   Labs and Imaging US Transvaginal Non-ob  07/18/2015   CLINICAL DATA:  Go go to 34 year old female with abnormal uterine bleeding and dysmenorrhea.  History of bilateral tubal ligation. LMP 06/16/2015, day 33 of menstrual cycle.  EXAM: TRANSABDOMINAL AND TRANSVAGINAL ULTRASOUND OF PELVIS  TECHNIQUE: Both transabdominal and transvaginal ultrasound examinations of the pelvis were performed. Transabdominal technique was performed for global imaging of the pelvis including uterus, ovaries, adnexal regions, and pelvic cul-de-sac. It was necessary to proceed with endovaginal exam following the transabdominal exam to visualize the endometrium and ovaries.  COMPARISON:  CT abdomen/pelvis from 06/02/2014.  FINDINGS: Uterus  Measurements: 9.5 x 5.6 x 6.2 cm. The anteverted uterus is mildly enlarged. There is a solitary right anterior uterine body subserosal 1.8 x 1.1 x 1.5 cm fibroid.  Endometrium  Thickness: 19 mm. The endometrium is thickened and mildly heterogeneous, with no focal endometrial lesion detected. There is trace fluid in the fundal endometrial cavity.  Right ovary  Measurements: 3.4 x 1.9 x 1.8 cm. Normal appearance/no adnexal mass.  Left ovary  Measurements: 3.5 x 1.5 x 2.4 cm. Normal appearance/no adnexal mass.  Other findings  No abnormal free fluid is seen in the pelvis.  IMPRESSION: 1. Abnormally thickened (19 mm) and mildly heterogeneous endometrium with trace fluid in the fundal endometrial cavity and no focal endometrial lesion detected. Consider follow-up pelvic ultrasound in 6-8 weeks in the early proliferative phase of the menstrual cycle after initiation of hormonal or medical therapy. If the abnormal uterine bleeding is already refractory to hormonal or medical therapy, consider endometrial biopsy, as clinically warranted. 2. Solitary small subserosal right anterior uterine body fibroid. 3. Normal ovaries.  No adnexal abnormality.   Electronically  Signed   By: Ilona Sorrel M.D.   On: 07/18/2015 10:17   US Pelvis Complete  07/18/2015   CLINICAL DATA:  Go go to 34 year old female with abnormal uterine bleeding and dysmenorrhea. History of  bilateral tubal ligation. LMP 06/16/2015, day 33 of menstrual cycle.  EXAM: TRANSABDOMINAL AND TRANSVAGINAL ULTRASOUND OF PELVIS  TECHNIQUE: Both transabdominal and transvaginal ultrasound examinations of the pelvis were performed. Transabdominal technique was performed for global imaging of the pelvis including uterus, ovaries, adnexal regions, and pelvic cul-de-sac. It was necessary to proceed with endovaginal exam following the transabdominal exam to visualize the endometrium and ovaries.  COMPARISON:  CT abdomen/pelvis from 06/02/2014.  FINDINGS: Uterus  Measurements: 9.5 x 5.6 x 6.2 cm. The anteverted uterus is mildly enlarged. There is a solitary right anterior uterine body subserosal 1.8 x 1.1 x 1.5 cm fibroid.  Endometrium  Thickness: 19 mm. The endometrium is thickened and mildly heterogeneous, with no focal endometrial lesion detected. There is trace fluid in the fundal endometrial cavity.  Right ovary  Measurements: 3.4 x 1.9 x 1.8 cm. Normal appearance/no adnexal mass.  Left ovary  Measurements: 3.5 x 1.5 x 2.4 cm. Normal appearance/no adnexal mass.  Other findings  No abnormal free fluid is seen in the pelvis.  IMPRESSION: 1. Abnormally thickened (19 mm) and mildly heterogeneous endometrium with trace fluid in the fundal endometrial cavity and no focal endometrial lesion detected. Consider follow-up pelvic ultrasound in 6-8 weeks in the early proliferative phase of the menstrual cycle after initiation of hormonal or medical therapy. If the abnormal uterine bleeding is already refractory to hormonal or medical therapy, consider endometrial biopsy, as clinically warranted. 2. Solitary small subserosal right anterior uterine body fibroid. 3. Normal ovaries.  No adnexal abnormality.   Electronically Signed   By: Ilona Sorrel M.D.   On: 07/18/2015 10:17   Procedure EMB Written informed consent Digital exam Speculum Betadine x2 Tenaculum anterior lip Sound to 9 cm EMB x2 Bleeding minimal Pt  tolerated procedure well, no complications  Assessment & Plan:   # AUB - 4 years of menorrhagia, worsening.  - upreg negative - u/s results as above, no obvious structural abnormalities - pap performed today - EMB performed today - CBC, TSH, and A1c - f/u one month after path results to discuss tx options      Maninder Deboer B. Braeden Kennan, [place] for [employer], [place]

## 2015-07-27 ENCOUNTER — Telehealth: Payer: Self-pay | Admitting: Internal Medicine

## 2015-07-27 LAB — HEMOGLOBIN A1C
Hgb A1c MFr Bld: 6 % — ABNORMAL HIGH (ref <5.7)
Mean Plasma Glucose: 126 mg/dL — ABNORMAL HIGH (ref <117)

## 2015-07-27 NOTE — Telephone Encounter (Signed)
Patient called requesting medication refill for butalbital-acetaminophen-caffeine (FIORICET) 50-325-40 MG per tablet.  Please f/u with patient

## 2015-07-27 NOTE — Telephone Encounter (Signed)
Patient called to request a med refill for butalbital-acetaminophen-caffeine (FIORICET) 50-325-40 MG per tablet. Please f/u with pt.

## 2015-07-28 ENCOUNTER — Other Ambulatory Visit: Payer: Self-pay

## 2015-07-28 ENCOUNTER — Other Ambulatory Visit: Payer: Self-pay | Admitting: Internal Medicine

## 2015-07-28 DIAGNOSIS — G44209 Tension-type headache, unspecified, not intractable: Secondary | ICD-10-CM

## 2015-07-28 LAB — CYTOLOGY - PAP

## 2015-07-28 MED ORDER — BUTALBITAL-APAP-CAFFEINE 50-325-40 MG PO TABS: 1.0000 | ORAL_TABLET | Freq: Four times a day (QID) | ORAL | Status: DC | PRN

## 2015-07-28 NOTE — Progress Notes (Unsigned)
Patient came into office requesting a refill on her migraine medication Prescription printed and given to the patient

## 2015-07-28 NOTE — Telephone Encounter (Signed)
Patient called requesting a medication refill of butalbital-acetaminophen-caffeine (FIORICET) 50-325-40 MG per tablet. Please f/u.

## 2015-08-01 ENCOUNTER — Telehealth: Payer: Self-pay | Admitting: *Deleted

## 2015-08-01 NOTE — Telephone Encounter (Signed)
Per Dr Si Raider, called patient, notified her that her PAP and EMB are normal. Encouraged her to make an appointment to be seen in the clinic if bleeding issues persist. Understanding voiced.

## 2015-08-14 ENCOUNTER — Other Ambulatory Visit: Payer: Self-pay | Admitting: Internal Medicine

## 2015-08-15 NOTE — Telephone Encounter (Signed)
Patricia Jenkins please refill patients Lexapro but explain to her that she should not be taking Tramadol with Lexapro all the time due to chances of reaction such as serotonin syndrome.

## 2015-08-15 NOTE — Telephone Encounter (Signed)
Patient called to request a med refill for escitalopram (LEXAPRO) 10 MG tablet. Please f/u with pt.

## 2015-08-15 NOTE — Telephone Encounter (Signed)
CMA called Pt, pt verified Name and DOB. Pt called 08/14/15 leaving a message requesting a refill of lexapro. Pt states that she requested it at her last OV but [facility] has not received an electronic rx for her medication refill. I told Pt that i would send a message to Chari Manning requesting a refill.

## 2015-08-16 ENCOUNTER — Telehealth: Payer: Self-pay

## 2015-08-16 MED ORDER — ESCITALOPRAM OXALATE 10 MG PO TABS: 10.0000 mg | ORAL_TABLET | Freq: Every day | ORAL | Status: DC

## 2015-08-16 NOTE — Telephone Encounter (Signed)
Patient called office requesting a refill on her lexapro Per NP Mateo Flow ok to send a prescription

## 2015-08-16 NOTE — Telephone Encounter (Signed)
Patient called back to check on the status of her med refill for Lexapro.

## 2015-08-16 NOTE — Telephone Encounter (Signed)
Attempted to notify patient her prescription has been sent to [facility] Patient not available Unable to leave voice mail-no mail box set up

## 2015-09-28 ENCOUNTER — Telehealth: Payer: Self-pay | Admitting: Internal Medicine

## 2015-09-28 NOTE — Telephone Encounter (Signed)
Pt. Called requesting med refills on the following medications:  butalbital-acetaminophen-caffeine (FIORICET) 50-325-40 MG per tablet traMADol (ULTRAM) 50 MG tablet   Please f/u with pt.

## 2015-10-03 ENCOUNTER — Telehealth: Payer: Self-pay

## 2015-10-03 NOTE — Telephone Encounter (Signed)
Patient called requesting a refill on her tramadol and migraine medication  Can these be refilled

## 2015-10-03 NOTE — Telephone Encounter (Signed)
Pt. Called requesting med refills on the following medications:  butalbital-acetaminophen-caffeine (FIORICET) 50-325-40 MG per tablet traMADol (ULTRAM) 50 MG tablet   Please f/u with pt.

## 2015-10-04 NOTE — Telephone Encounter (Signed)
Patient called and checked on the status of her med refills for Tramadol and migraine medications. Please f/u

## 2015-10-10 ENCOUNTER — Other Ambulatory Visit: Payer: Self-pay | Admitting: Internal Medicine

## 2015-10-10 DIAGNOSIS — G44209 Tension-type headache, unspecified, not intractable: Secondary | ICD-10-CM

## 2015-10-10 MED ORDER — TRAMADOL HCL 50 MG PO TABS: 50.0000 mg | ORAL_TABLET | Freq: Two times a day (BID) | ORAL | Status: DC | PRN

## 2015-10-10 MED ORDER — BUTALBITAL-APAP-CAFFEINE 50-325-40 MG PO TABS: 1.0000 | ORAL_TABLET | Freq: Four times a day (QID) | ORAL | Status: DC | PRN

## 2015-10-10 NOTE — Telephone Encounter (Signed)
meds have been refilled. On printer

## 2015-11-21 ENCOUNTER — Other Ambulatory Visit: Payer: Self-pay | Admitting: Internal Medicine

## 2015-11-21 NOTE — Telephone Encounter (Signed)
I have never prescribed this for patient. She will need to be seen to discuss this medication and if she needs to be on it daily

## 2015-11-22 NOTE — Telephone Encounter (Signed)
Tried to get in touch with patient to have her schedule an office visit with Chari Manning. She is requesting valacyclovir but needs an office visit before it can be prescribed. I was unable to reach her or leave a message.

## 2015-12-04 ENCOUNTER — Telehealth: Payer: Self-pay

## 2015-12-04 ENCOUNTER — Telehealth: Payer: Self-pay | Admitting: Internal Medicine

## 2015-12-04 MED ORDER — ESCITALOPRAM OXALATE 10 MG PO TABS: 10.0000 mg | ORAL_TABLET | Freq: Every day | ORAL | Status: DC

## 2015-12-04 NOTE — Telephone Encounter (Signed)
Pt. Called requesting a med refill on escitalopram (LEXAPRO) 10 MG tablet. Please f/u with pt.

## 2015-12-04 NOTE — Telephone Encounter (Signed)
Returned call to patient Patient not available Unable to leave message-voice mail is not set up

## 2016-02-05 ENCOUNTER — Telehealth: Payer: Self-pay | Admitting: Internal Medicine

## 2016-02-05 NOTE — Telephone Encounter (Signed)
Pt. Called requesting a med refill on the following medications:  Tramadol diclofenac (VOLTAREN) 75 MG EC tablet  Please f/u

## 2016-02-06 ENCOUNTER — Telehealth: Payer: Self-pay

## 2016-02-06 NOTE — Telephone Encounter (Signed)
Called back patient in reference to her refill request on tramadol and diclofenac Per provider patient will need an appt to be seen

## 2016-02-09 ENCOUNTER — Encounter (HOSPITAL_COMMUNITY): Payer: Self-pay | Admitting: Emergency Medicine

## 2016-02-09 ENCOUNTER — Emergency Department (INDEPENDENT_AMBULATORY_CARE_PROVIDER_SITE_OTHER)
Admission: EM | Admit: 2016-02-09 | Discharge: 2016-02-09 | Disposition: A | Discharge status: Home / Self Care | Payer: Medicaid Other | Source: Home / Self Care | Attending: Emergency Medicine | Admitting: Emergency Medicine

## 2016-02-09 DIAGNOSIS — A6000 Herpesviral infection of urogenital system, unspecified: Secondary | ICD-10-CM | POA: Diagnosis not present

## 2016-02-09 MED ORDER — VALACYCLOVIR HCL 1 G PO TABS: 1000.0000 mg | ORAL_TABLET | Freq: Every day | ORAL | Status: AC

## 2016-02-09 NOTE — ED Notes (Signed)
Here for medication refill Valtrex, herpes outbreak to vaginal area and rectum Painful noted Unable to get refill until seen by doctor

## 2016-02-09 NOTE — Discharge Instructions (Signed)
I have provided a refill of your Valtrex. With the once a day dosing, please take it for 5 days at the start of an outbreak. Once you have started the Valtrex, you can use a small amount of over-the-counter hydrocortisone cream to try to help with the discomfort. Follow-up as needed.

## 2016-02-09 NOTE — ED Provider Notes (Signed)
CSN: [id number]     Arrival date & time 02/09/16  1304 History   First MD Initiated Contact with Patient 02/09/16 1328     Chief Complaint  Patient presents with  . Medication Refill   (Consider location/radiation/quality/duration/timing/severity/associated sxs/prior Treatment) HPI She is a 35 year old woman here for herpes outbreak. 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 She typically gets 2-4 outbreaks per year. She has been on Valtrex in the past. Her current outbreak started 4-5 days ago. It is her typical outbreak. She denies any discharge or fevers with it. She is unable to get an appointment with her PCP for a refill.  No concern for other STDs.  Past Medical History  Diagnosis Date  . Herpes   . Pneumonia 2010  . Chronic headaches    Past Surgical History  Procedure Laterality Date  . Dilation and curettage of uterus    . Tubal ligation    . Cholecystectomy  10/03/2011    Procedure: LAPAROSCOPIC CHOLECYSTECTOMY WITH INTRAOPERATIVE CHOLANGIOGRAM;  Surgeon: Harl Bowie, MD;  Location: [facility];  Service: General;  Laterality: N/A;   Family History  Problem Relation Age of Onset  . Hypertension Mother   . Hypothyroidism Mother   . Diabetes Father   . Hypertension Father    Social History  Substance Use Topics  . Smoking status: Former Research scientist (life sciences)  . Smokeless tobacco: Never Used  . Alcohol Use: Yes     Comment: occasionally   OB History    Gravida Para Term Preterm AB TAB SAB Ectopic Multiple Living   4 3 3  1  1   3      Review of Systems As in history of present illness Allergies  Review of patient's allergies indicates no known allergies.  Home Medications   Prior to Admission medications   Medication Sig Start Date End Date Taking? Authorizing Provider  butalbital-acetaminophen-caffeine (FIORICET) 50-325-40 MG tablet Take 1 tablet by mouth every 6 (six) hours as needed for headache. 10/10/15 10/09/16  Lance Bosch, NP  cyclobenzaprine  (FLEXERIL) 10 MG tablet Take 1 tablet (10 mg total) by mouth 3 (three) times daily as needed for muscle spasms. 07/03/15   Lance Bosch, NP  diclofenac (VOLTAREN) 75 MG EC tablet Take 1 tablet (75 mg total) by mouth 2 (two) times daily. 04/20/15   Deanna M Didiano, DO  escitalopram (LEXAPRO) 10 MG tablet Take 1 tablet (10 mg total) by mouth daily. 12/04/15   Lance Bosch, NP  traMADol (ULTRAM) 50 MG tablet Take 1 tablet (50 mg total) by mouth 2 (two) times daily as needed. 10/10/15   Lance Bosch, NP  valACYclovir (VALTREX) 1000 MG tablet Take 1 tablet (1,000 mg total) by mouth daily. For 5 days at start of outbreaks. 02/09/16 02/23/16  Melony Overly, MD   Meds Ordered and Administered this Visit  Medications - No data to display  BP 121/72 mmHg  Pulse 97  Temp(Src) 97.9 F (36.6 C) (Oral)  Resp 18  SpO2 100%  LMP 01/14/2016 No data found.   Physical Exam  Constitutional: She is oriented to person, place, and time. She appears well-developed and well-nourished. No distress.  Cardiovascular: Normal rate.   Pulmonary/Chest: Effort normal.  Genitourinary:  Deferred  Neurological: She is alert and oriented to person, place, and time.    ED Course  Procedures (including critical care time)  Labs Review Labs Reviewed - No data to display  Imaging Review No results found.  MDM   1. Genital herpes    Prescription sent to her pharmacy for Valtrex. Once she is on the Valtrex, recommended small amount of OTC hydrocortisone cream to help with the discomfort. Follow-up as needed.    Melony Overly, MD 02/09/16 1345

## 2016-02-29 IMAGING — CR DG ABDOMEN ACUTE W/ 1V CHEST
3 series · 3 of 3 positions shown · non-contrast
Comparison: Chest radiograph November 01, 2008 and abdomen
radiograph May 21, 2005

CLINICAL DATA: Abdominal pain with nausea

EXAM:
ACUTE ABDOMEN SERIES (ABDOMEN 2 VIEW & CHEST 1 VIEW)

[w chest pa]
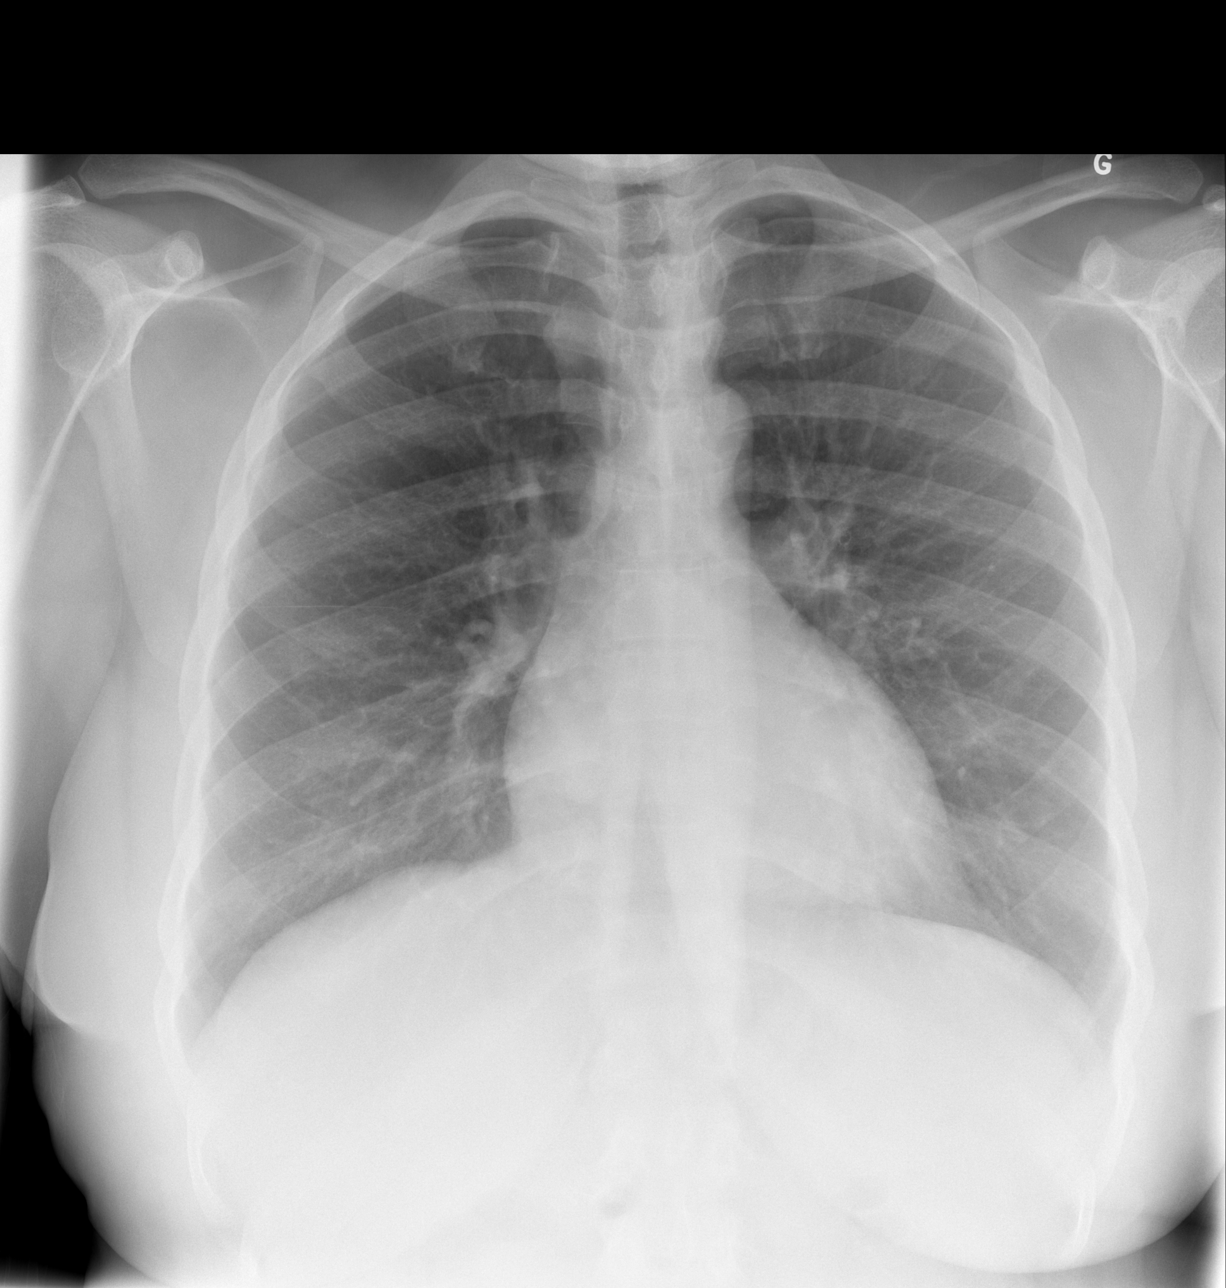

[w abdomen upright]
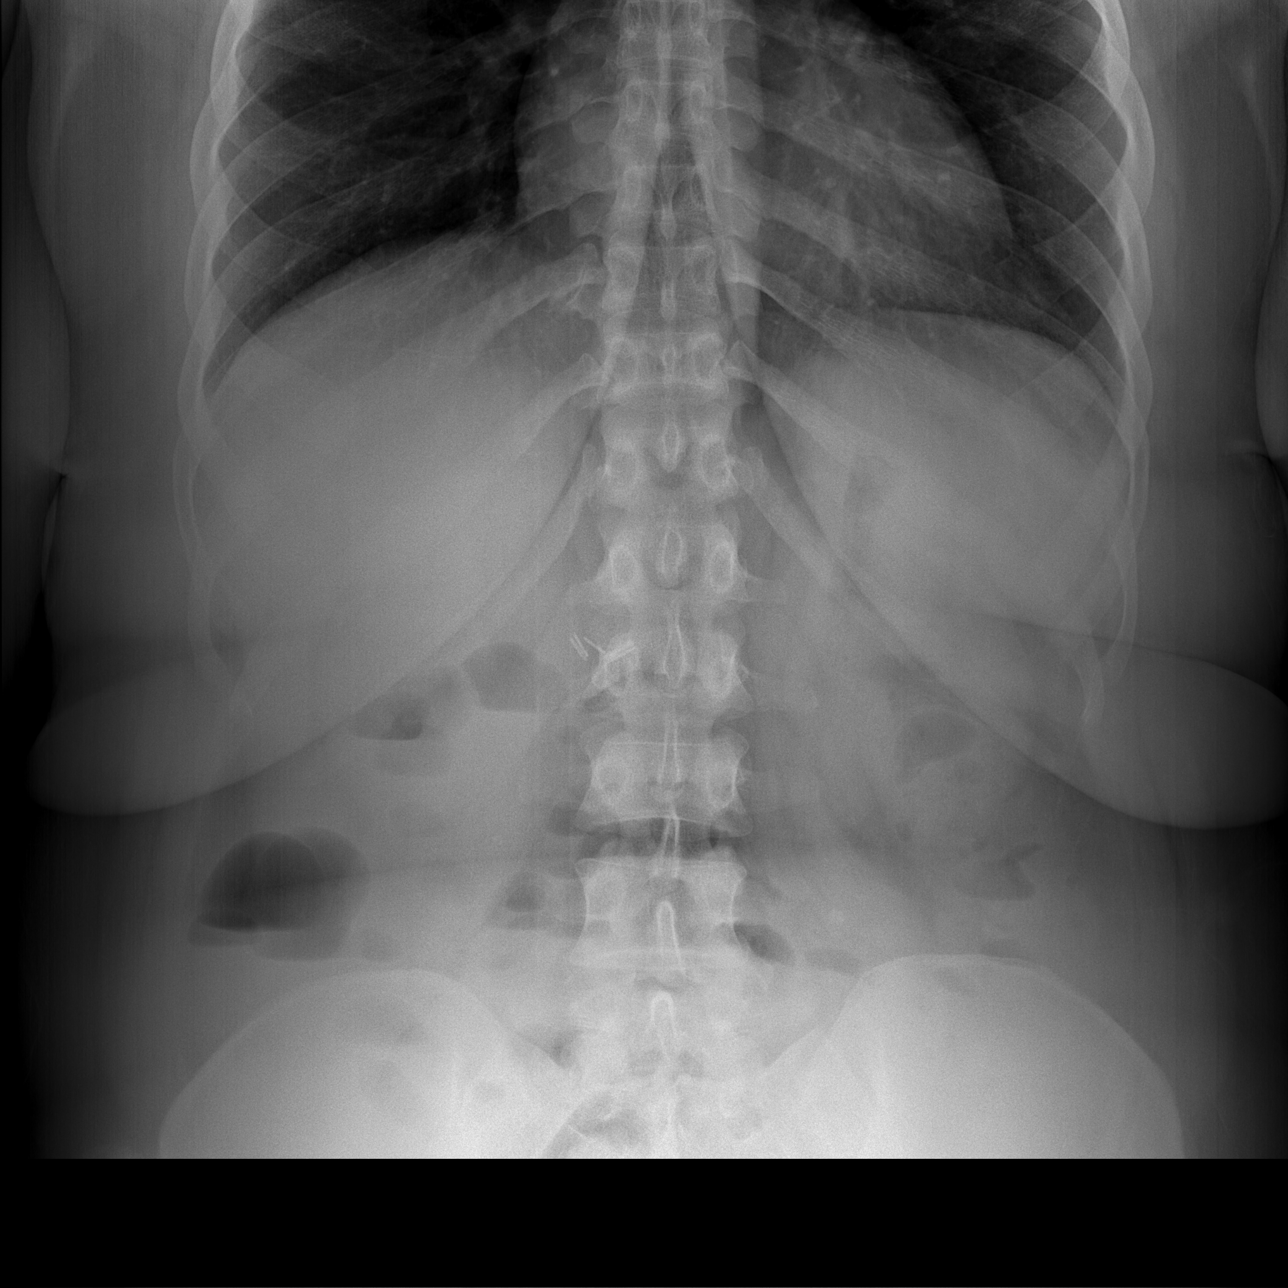

[t abdomen supine]
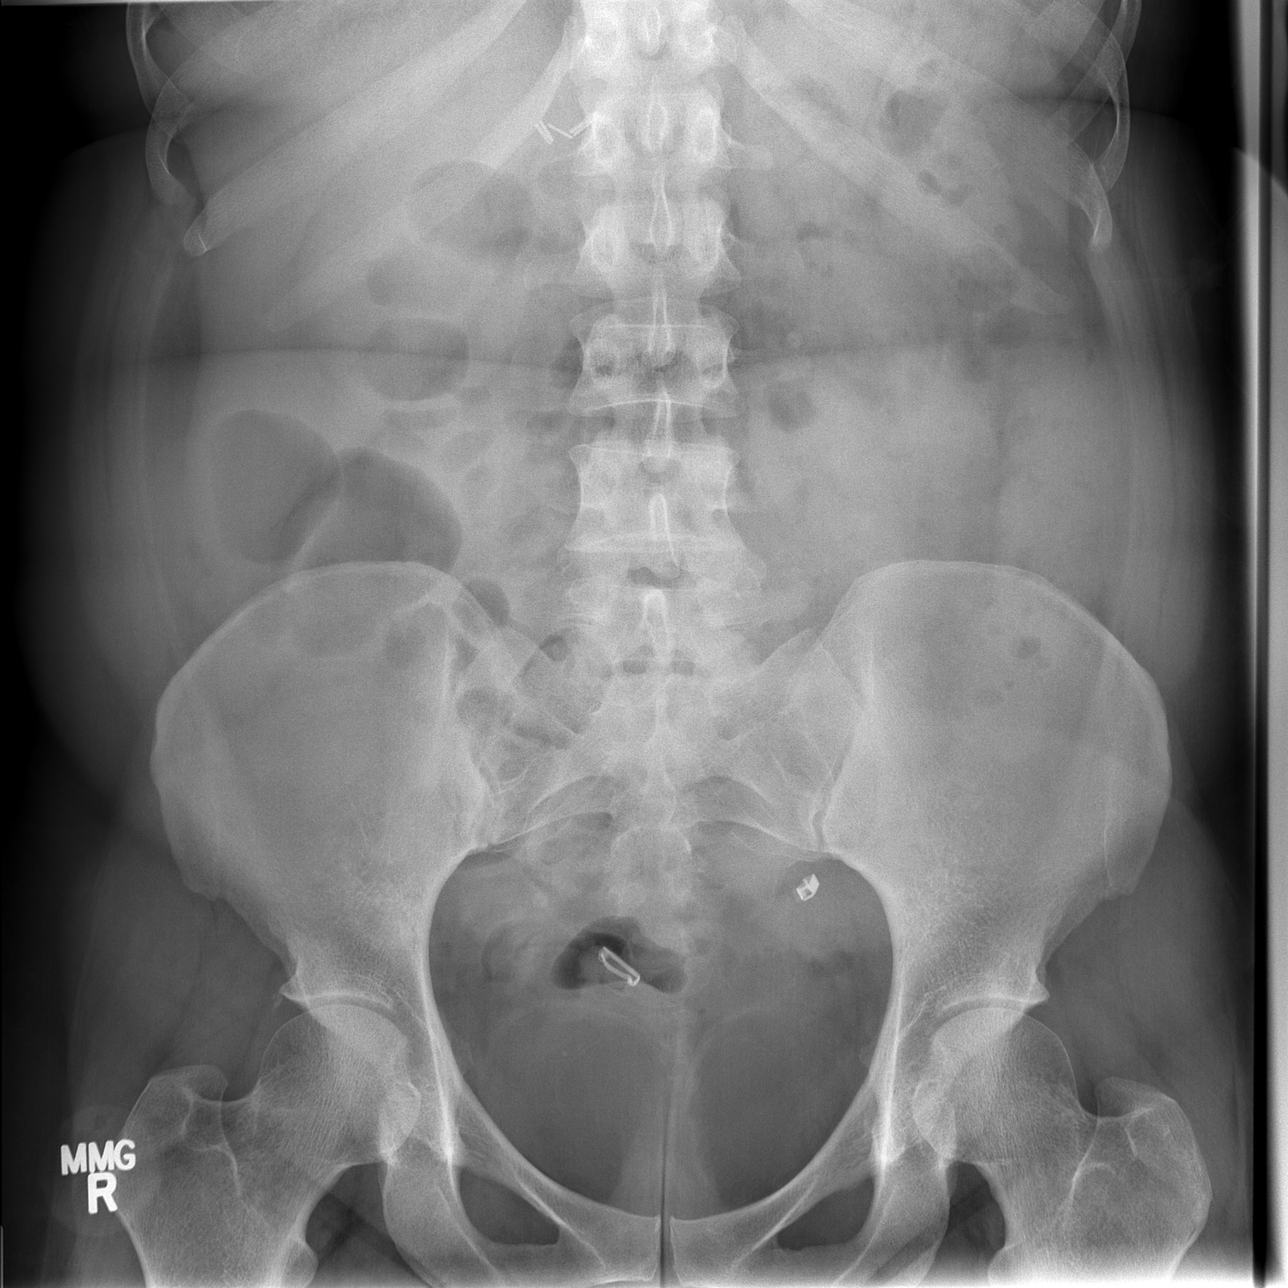

[3 of 3 positions shown; findings below may reference images not displayed]

FINDINGS: PA chest: Lungs are clear. The heart size and pulmonary vascularity
are normal. No adenopathy.

Supine and upright abdomen: There is no appreciable bowel
dilatation. There are scattered air-fluid levels throughout the
abdomen, however. No free air. There are surgical clips in the
gallbladder fossa region as well as in the pelvis.
IMPRESSION: Scattered air-fluid levels. Suspect early ileus or enteritis. A
degree of obstruction is possible but felt to be less likely. No
free air. Lungs clear.

## 2016-02-29 IMAGING — CT CT ABD-PELV W/ CM
2 of 4 series · 16 of 46 positions shown, 18 images · IV contrast (Omni 300)
Comparison: Acute abdominal series June 02, 2014

CLINICAL DATA: Generalized abdominal pain.

EXAM:
CT ABDOMEN AND PELVIS WITH CONTRAST
TECHNIQUE: Multidetector CT imaging of the abdomen and pelvis was performed
using the standard protocol following bolus administration of
intravenous contrast.
CONTRAST:  80mL OMNIPAQUE IOHEXOL 300 MG/ML  SOLN

[Series 4: coronals · coronal · 0.70mm/px · 3 of 143 slices shown]
[im 48/143  soft-tissue]
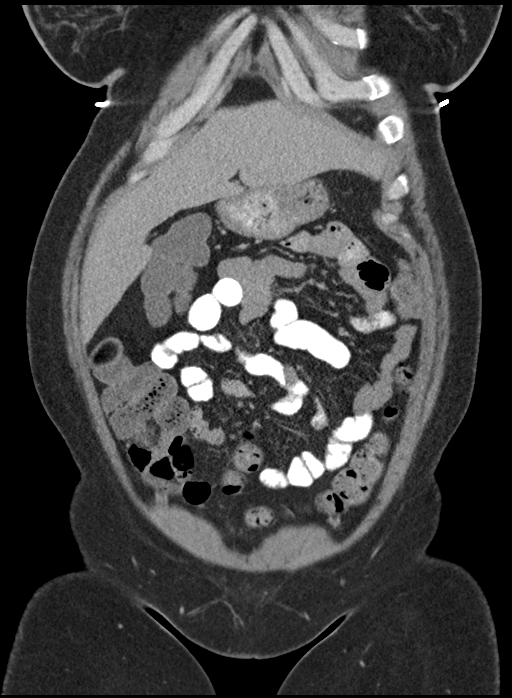
[im 64/143  soft-tissue]
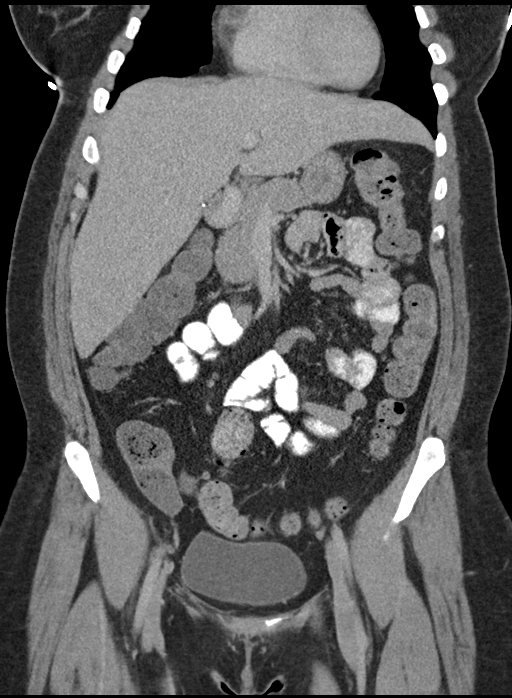
[im 79/143  soft-tissue]
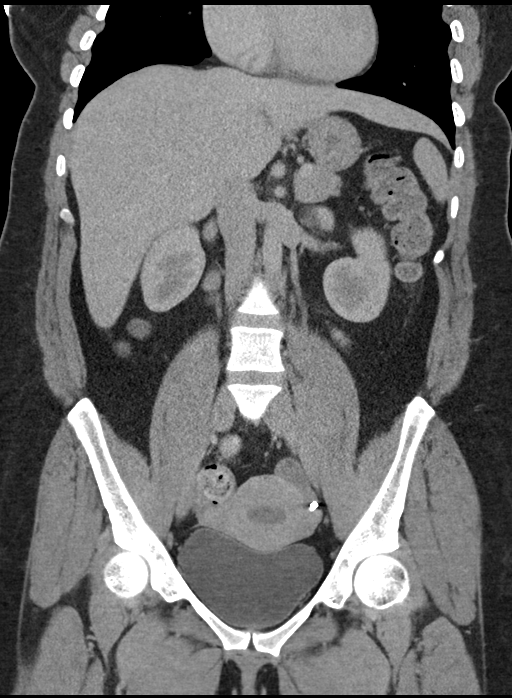

[Series 6: abd/ pelvis 5.0 i30f 1 · axial · 0.88mm/px · z∈[-499,-59]mm · 13 of 97 slices shown, 15 images]
[im 5/97  soft-tissue]
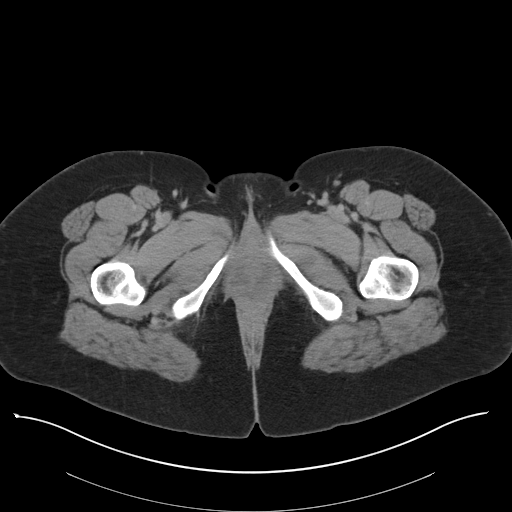
[im 5/97  bone]
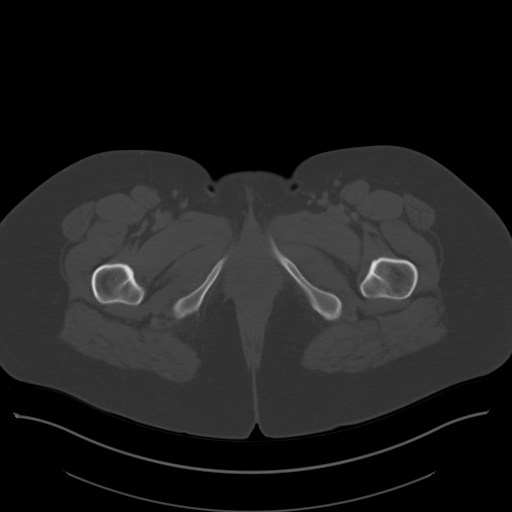
[im 13/97  soft-tissue]
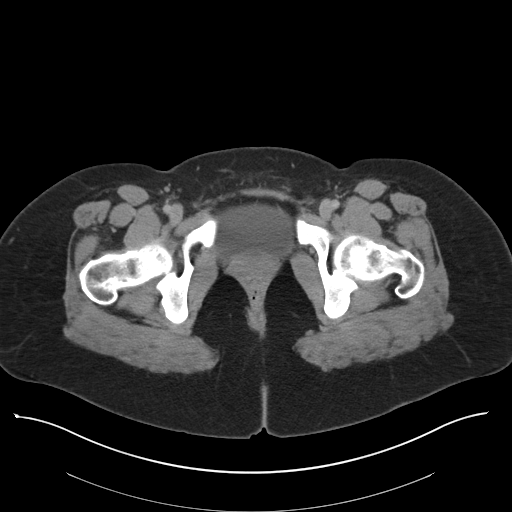
[im 21/97  soft-tissue]
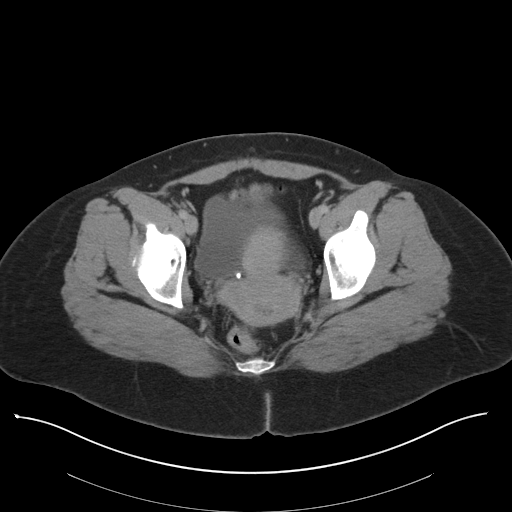
[im 29/97  soft-tissue]
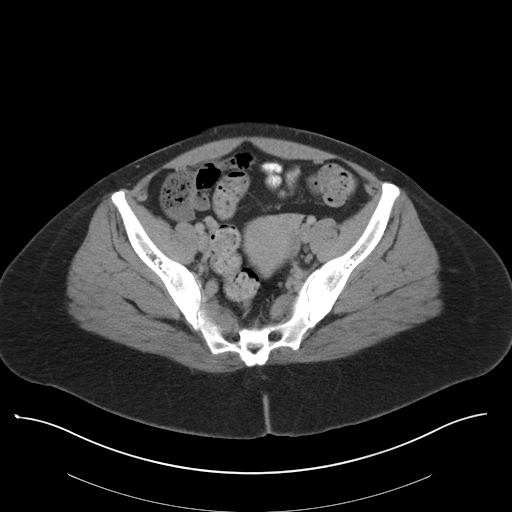
[im 33/97  soft-tissue]
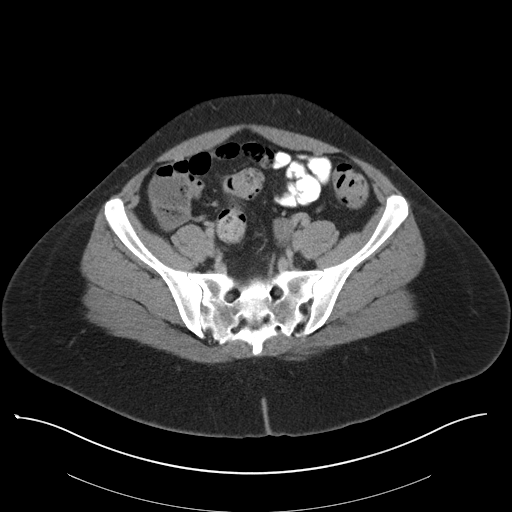
[im 41/97  soft-tissue]
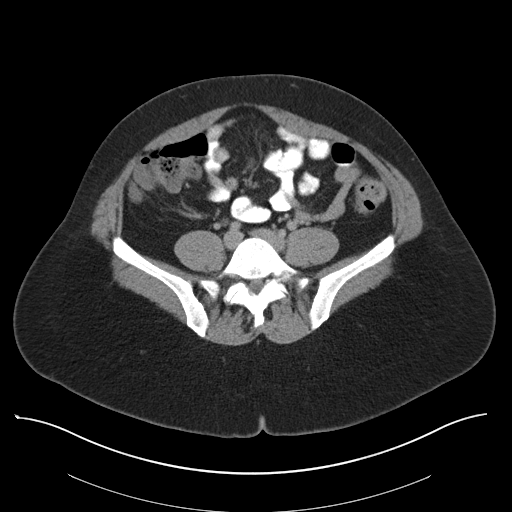
[im 49/97  soft-tissue]
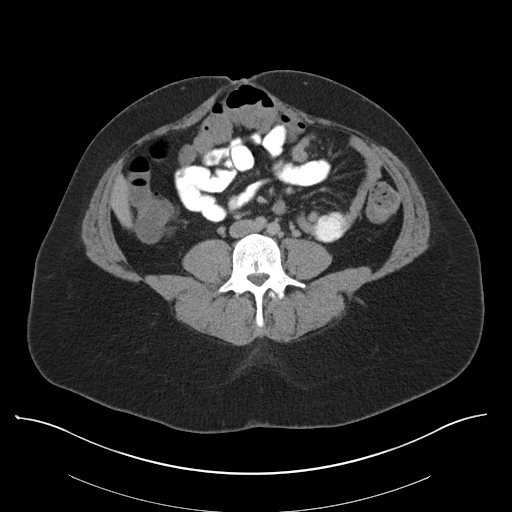
[im 57/97  soft-tissue]
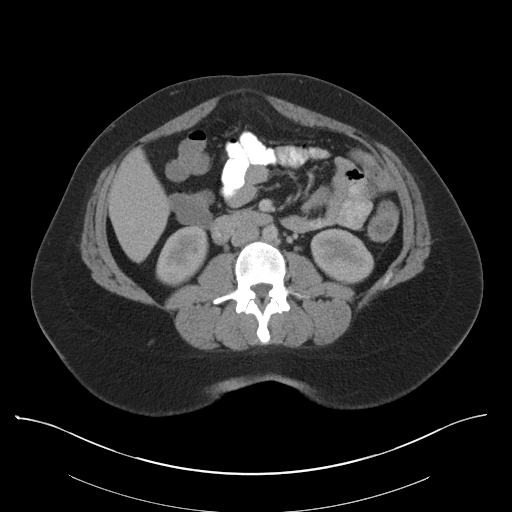
[im 65/97  soft-tissue]
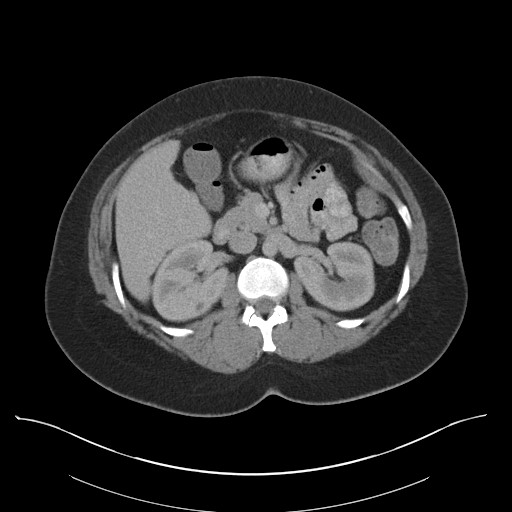
[im 65/97  bone]
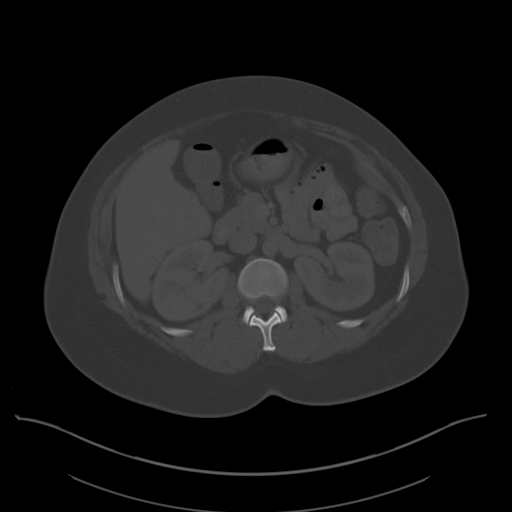
[im 69/97  soft-tissue]
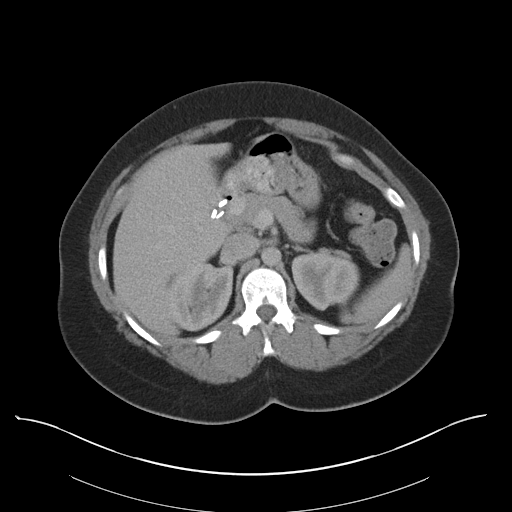
[im 77/97  soft-tissue]
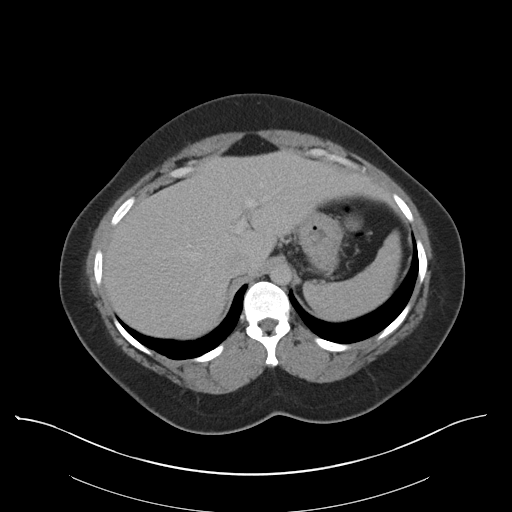
[im 85/97  soft-tissue]
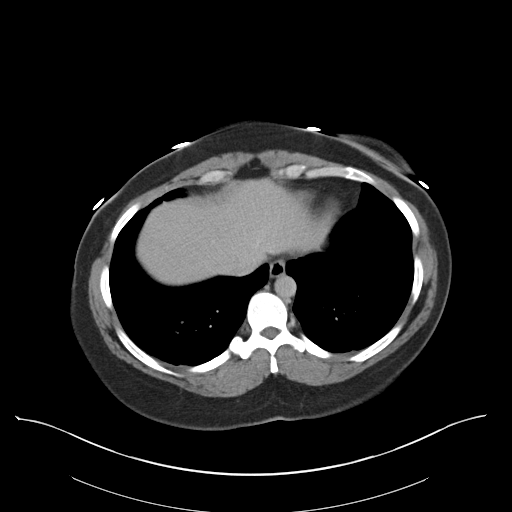
[im 93/97  soft-tissue]
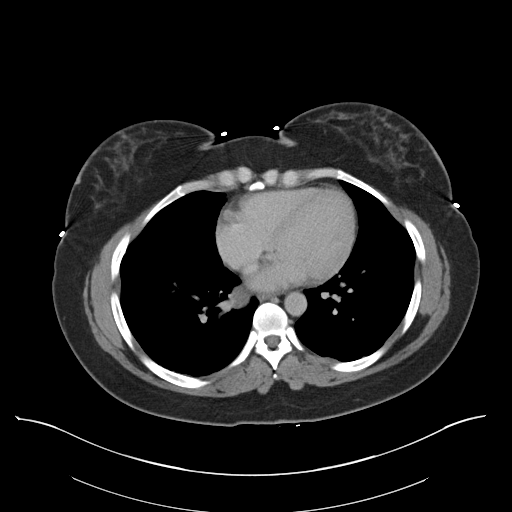

[16 of 46 positions shown; findings below may reference images not displayed]

FINDINGS: LUNG BASES: Included view of the lung bases are clear. Visualized
heart and pericardium are unremarkable.

SOLID ORGANS: The liver, spleen, pancreas and adrenal glands are
unremarkable. Status post cholecystectomy.

GASTROINTESTINAL TRACT: The stomach, small and large bowel are
normal in course and caliber without inflammatory changes. Enteric
contrast has not yet reached the distal small bowel. Moderate amount
of retained large bowel stool. Normal appendix.

KIDNEYS/ URINARY TRACT: Kidneys are orthotopic, demonstrating
symmetric enhancement. No nephrolithiasis, hydronephrosis or solid
renal masses. Too small to characterize hypodensity right kidney. 15
mm right interpolar cyst, scarring or upper pole left kidney, 11 mm
right lower pole cyst. The unopacified ureters are normal in course
and caliber. Urinary bladder is partially distended and
unremarkable.

PERITONEUM/RETROPERITONEUM: Granuloma left anterior abdomen No
intraperitoneal free fluid nor free air. Aortoiliac vessels are
normal in course and caliber. No lymphadenopathy by CT size
criteria. Internal reproductive organs are normal appearance, tubal
ligation clips present.

SOFT TISSUE/OSSEOUS STRUCTURES: Nonsuspicious. Mild diastases of the
rectus abdominis muscles.
IMPRESSION: Moderate amount of retained large bowel stool without obstruction
nor acute intra-abdominal/pelvic process.

  By: Rtoyota Joshjax

## 2016-09-12 ENCOUNTER — Ambulatory Visit: Payer: Medicaid Other | Attending: Family Medicine | Admitting: Family Medicine

## 2016-09-12 ENCOUNTER — Encounter: Payer: Self-pay | Admitting: Family Medicine

## 2016-09-12 VITALS — BP 136/82 | HR 107 | Temp 98.2°F | Ht 69.0 in | Wt 227.4 lb

## 2016-09-12 DIAGNOSIS — J329 Chronic sinusitis, unspecified: Secondary | ICD-10-CM | POA: Diagnosis present

## 2016-09-12 DIAGNOSIS — J019 Acute sinusitis, unspecified: Secondary | ICD-10-CM

## 2016-09-12 DIAGNOSIS — Z87891 Personal history of nicotine dependence: Secondary | ICD-10-CM | POA: Diagnosis not present

## 2016-09-12 DIAGNOSIS — Z833 Family history of diabetes mellitus: Secondary | ICD-10-CM | POA: Diagnosis not present

## 2016-09-12 DIAGNOSIS — R631 Polydipsia: Secondary | ICD-10-CM | POA: Diagnosis not present

## 2016-09-12 DIAGNOSIS — Z8249 Family history of ischemic heart disease and other diseases of the circulatory system: Secondary | ICD-10-CM | POA: Insufficient documentation

## 2016-09-12 DIAGNOSIS — Z8349 Family history of other endocrine, nutritional and metabolic diseases: Secondary | ICD-10-CM | POA: Diagnosis not present

## 2016-09-12 DIAGNOSIS — N920 Excessive and frequent menstruation with regular cycle: Secondary | ICD-10-CM | POA: Diagnosis not present

## 2016-09-12 DIAGNOSIS — R358 Other polyuria: Secondary | ICD-10-CM

## 2016-09-12 DIAGNOSIS — R3589 Other polyuria: Secondary | ICD-10-CM

## 2016-09-12 LAB — POCT GLYCOSYLATED HEMOGLOBIN (HGB A1C): Hemoglobin A1C: 5.6

## 2016-09-12 LAB — CBC
HCT: 36.2 % (ref 35.0–45.0)
Hemoglobin: 11.8 g/dL (ref 11.7–15.5)
MCH: 27.5 pg (ref 27.0–33.0)
MCHC: 32.6 g/dL (ref 32.0–36.0)
MCV: 84.4 fL (ref 80.0–100.0)
MPV: 9.5 fL (ref 7.5–12.5)
Platelets: 277 10*3/uL (ref 140–400)
RBC: 4.29 MIL/uL (ref 3.80–5.10)
RDW: 15.4 % — AB (ref 11.0–15.0)
WBC: 8.5 10*3/uL (ref 3.8–10.8)

## 2016-09-12 LAB — GLUCOSE, POCT (MANUAL RESULT ENTRY): POC GLUCOSE: 92 mg/dL (ref 70–99)

## 2016-09-12 MED ORDER — AMOXICILLIN 875 MG PO TABS: 875.0000 mg | ORAL_TABLET | Freq: Two times a day (BID) | ORAL | 0 refills | Status: DC

## 2016-09-12 MED ORDER — FLUCONAZOLE 150 MG PO TABS: 150.0000 mg | ORAL_TABLET | Freq: Once | ORAL | 0 refills | Status: AC

## 2016-09-12 MED ORDER — CETIRIZINE HCL 10 MG PO TABS: 10.0000 mg | ORAL_TABLET | Freq: Every day | ORAL | 11 refills | Status: DC

## 2016-09-12 NOTE — Progress Notes (Signed)
LOGO@  Subjective:  Patient ID: Patricia Jenkins, female    DOB: Sep 22, 1981  Age: 35 y.o. MRN: [id number]  CC: Sinusitis   HPI Patricia Jenkins    1. Sinusitis: she reports 10 days of facial pain and sneezing. No fever or chills. Rhinorrhea. No known sick contacts with similar symptoms. She is a non smoker.   2. Concerned about menopause: she reports irritable mood and heavier than normal periods. She is s/p BTL.   Past Medical History:  Diagnosis Date  . Chronic headaches   . Herpes   . Pneumonia 2010    Past Surgical History:  Procedure Laterality Date  . CHOLECYSTECTOMY  10/03/2011   Procedure: LAPAROSCOPIC CHOLECYSTECTOMY WITH INTRAOPERATIVE CHOLANGIOGRAM;  Surgeon: Harl Bowie, MD;  Location: [facility];  Service: General;  Laterality: N/A;  . DILATION AND CURETTAGE OF UTERUS    . TUBAL LIGATION      Family History  Problem Relation Age of Onset  . Hypertension Mother   . Hypothyroidism Mother   . Diabetes Father   . Hypertension Father     Social History  Substance Use Topics  . Smoking status: Former Research scientist (life sciences)  . Smokeless tobacco: Never Used  . Alcohol use Yes     Comment: occasionally    ROS Review of Systems  Constitutional: Negative Jenkins chills and fever.  HENT: Positive Jenkins rhinorrhea, sinus pressure and sneezing.   Eyes: Negative Jenkins visual disturbance.  Respiratory: Negative Jenkins shortness of breath.   Cardiovascular: Negative Jenkins chest pain.  Gastrointestinal: Negative Jenkins abdominal pain and blood in stool.  Endocrine: Positive Jenkins polydipsia, polyphagia and polyuria.  Musculoskeletal: Negative Jenkins arthralgias and back pain.  Skin: Negative Jenkins rash.  Allergic/Immunologic: Negative Jenkins immunocompromised state.  Hematological: Negative Jenkins adenopathy. Does not bruise/bleed easily.  Psychiatric/Behavioral: Negative Jenkins dysphoric mood and suicidal ideas.    Objective:   Today's Vitals: BP 136/82 (BP Location: Left Arm, Patient Position:  Sitting, Cuff Size: Small)   Pulse (!) 107   Temp 98.2 F (36.8 C) (Oral)   Ht 5\' 9"  (1.753 m)   Wt 227 lb 6.4 oz (103.1 kg)   LMP 08/22/2016   SpO2 99%   BMI 33.58 kg/m   Physical Exam  Constitutional: She is oriented to person, place, and time. She appears well-developed and well-nourished. No distress.  HENT:  Head: Normocephalic and atraumatic.  Right Ear: Tympanic membrane, external ear and ear canal normal.  Left Ear: Tympanic membrane, external ear and ear canal normal.  Nose: Mucosal edema present. Right sinus exhibits frontal sinus tenderness. Right sinus exhibits no maxillary sinus tenderness. Left sinus exhibits frontal sinus tenderness. Left sinus exhibits no maxillary sinus tenderness.  Mouth/Throat: Oropharynx is clear and moist and mucous membranes are normal.  Eyes: Conjunctivae and EOM are normal. Pupils are equal, round, and reactive to light.  Cardiovascular: Normal rate, regular rhythm, normal heart sounds and intact distal pulses.   Pulmonary/Chest: Effort normal and breath sounds normal.  Musculoskeletal: She exhibits no edema.  Lymphadenopathy:    She has no cervical adenopathy.  Neurological: She is alert and oriented to person, place, and time.  Skin: Skin is warm and dry. No rash noted.  Psychiatric: She has a normal mood and affect.   Lab Results  Component Value Date   HGBA1C 5.6 09/12/2016    Assessment & Plan:   Problem List Items Addressed This Visit    None    Visit Diagnoses    Menorrhagia with  regular cycle    -  Primary   Relevant Orders   FSH/LH   Acute non-recurrent sinusitis, unspecified location       Relevant Medications   cetirizine (ZYRTEC) 10 MG tablet   fluconazole (DIFLUCAN) 150 MG tablet   amoxicillin (AMOXIL) 875 MG tablet   Polydipsia       Relevant Orders   HgB A1c   Glucose (CBG)   CBC   COMPLETE METABOLIC PANEL WITH GFR   Polyuria       Relevant Orders   HgB A1c   Glucose (CBG)   CBC   COMPLETE METABOLIC  PANEL WITH GFR      Outpatient Encounter Prescriptions as of 09/12/2016  Medication Sig  . butalbital-acetaminophen-caffeine (FIORICET) 50-325-40 MG tablet Take 1 tablet by mouth every 6 (six) hours as needed Jenkins headache. (Patient not taking: Reported on 09/12/2016)  . cyclobenzaprine (FLEXERIL) 10 MG tablet Take 1 tablet (10 mg total) by mouth 3 (three) times daily as needed Jenkins muscle spasms. (Patient not taking: Reported on 09/12/2016)  . diclofenac (VOLTAREN) 75 MG EC tablet Take 1 tablet (75 mg total) by mouth 2 (two) times daily. (Patient not taking: Reported on 09/12/2016)  . escitalopram (LEXAPRO) 10 MG tablet Take 1 tablet (10 mg total) by mouth daily. (Patient not taking: Reported on 09/12/2016)  . traMADol (ULTRAM) 50 MG tablet Take 1 tablet (50 mg total) by mouth 2 (two) times daily as needed. (Patient not taking: Reported on 09/12/2016)   No facility-administered encounter medications on file as of 09/12/2016.     Follow-up: No Follow-up on file.    Boykin Nearing MD

## 2016-09-12 NOTE — Patient Instructions (Addendum)
Patricia Jenkins was seen today for sinusitis.  Diagnoses and all orders for this visit:  Menorrhagia with regular cycle -     FSH/LH  Acute non-recurrent sinusitis, unspecified location -     cetirizine (ZYRTEC) 10 MG tablet; Take 1 tablet (10 mg total) by mouth daily. -     fluconazole (DIFLUCAN) 150 MG tablet; Take 1 tablet (150 mg total) by mouth once. -     amoxicillin (AMOXIL) 875 MG tablet; Take 1 tablet (875 mg total) by mouth 2 (two) times daily.  Polydipsia -     HgB A1c -     Glucose (CBG) -     CBC -     COMPLETE METABOLIC PANEL WITH GFR  Polyuria -     HgB A1c -     Glucose (CBG) -     CBC -     COMPLETE METABOLIC PANEL WITH GFR    Drink plenty of water Tylenol and ibuprofen are safe for aches and pains related to sinus infection  F/u in 3 months for wellness check   Dr. Adrian Blackwater

## 2016-09-12 NOTE — Progress Notes (Signed)
Pt is here today to check for menopause, and pt has been sneezing and face hurts.  Pt declined flu shot.

## 2016-09-13 ENCOUNTER — Telehealth: Payer: Self-pay

## 2016-09-13 LAB — COMPLETE METABOLIC PANEL WITH GFR
ALBUMIN: 4.1 g/dL (ref 3.6–5.1)
ALK PHOS: 63 U/L (ref 33–115)
ALT: 15 U/L (ref 6–29)
AST: 19 U/L (ref 10–30)
BUN: 8 mg/dL (ref 7–25)
CHLORIDE: 106 mmol/L (ref 98–110)
CO2: 26 mmol/L (ref 20–31)
Calcium: 9.4 mg/dL (ref 8.6–10.2)
Creat: 0.74 mg/dL (ref 0.50–1.10)
GFR, Est African American: >89 mL/min (ref >=60)
GLUCOSE: 86 mg/dL (ref 65–99)
POTASSIUM: 3.9 mmol/L (ref 3.5–5.3)
SODIUM: 139 mmol/L (ref 135–146)
Total Bilirubin: 0.3 mg/dL (ref 0.2–1.2)
Total Protein: 7.3 g/dL (ref 6.1–8.1)

## 2016-09-13 LAB — FSH/LH
FSH: 3 m[IU]/mL
LH: 2.3 m[IU]/mL

## 2016-09-13 NOTE — Telephone Encounter (Signed)
Pt was called on 10/27 and informed of lab results. 

## 2016-09-14 DIAGNOSIS — J019 Acute sinusitis, unspecified: Secondary | ICD-10-CM | POA: Insufficient documentation

## 2016-09-14 DIAGNOSIS — N920 Excessive and frequent menstruation with regular cycle: Secondary | ICD-10-CM | POA: Insufficient documentation

## 2016-09-14 NOTE — Assessment & Plan Note (Signed)
Heavy menses and irritable mood Normal FSH and LH, no evidence of menopause

## 2016-09-14 NOTE — Assessment & Plan Note (Signed)
Course of amoxicillin followed by diflucan to prevent yeast

## 2016-11-28 ENCOUNTER — Encounter: Payer: Self-pay | Admitting: Physician Assistant

## 2016-11-28 ENCOUNTER — Ambulatory Visit: Payer: Medicaid Other | Attending: Internal Medicine | Admitting: Physician Assistant

## 2016-11-28 ENCOUNTER — Other Ambulatory Visit (HOSPITAL_COMMUNITY)
Admission: RE | Admit: 2016-11-28 | Discharge: 2016-11-28 | Disposition: A | Discharge status: Home / Self Care | Payer: Medicaid Other | Source: Ambulatory Visit | Attending: Internal Medicine | Admitting: Internal Medicine

## 2016-11-28 VITALS — BP 118/80 | HR 81 | Temp 98.6°F | Resp 16 | Wt 218.2 lb

## 2016-11-28 DIAGNOSIS — N898 Other specified noninflammatory disorders of vagina: Secondary | ICD-10-CM | POA: Insufficient documentation

## 2016-11-28 DIAGNOSIS — N3289 Other specified disorders of bladder: Secondary | ICD-10-CM | POA: Insufficient documentation

## 2016-11-28 DIAGNOSIS — R301 Vesical tenesmus: Secondary | ICD-10-CM

## 2016-11-28 DIAGNOSIS — M25559 Pain in unspecified hip: Secondary | ICD-10-CM | POA: Diagnosis not present

## 2016-11-28 DIAGNOSIS — R102 Pelvic and perineal pain: Secondary | ICD-10-CM | POA: Diagnosis not present

## 2016-11-28 LAB — POCT URINALYSIS DIPSTICK
BILIRUBIN UA: NEGATIVE
Blood, UA: NEGATIVE
GLUCOSE UA: NEGATIVE
Ketones, UA: NEGATIVE
Leukocytes, UA: NEGATIVE
Nitrite, UA: NEGATIVE
Protein, UA: NEGATIVE
Spec Grav, UA: 1.02
UROBILINOGEN UA: 0.2
pH, UA: 6

## 2016-11-28 LAB — POCT URINE PREGNANCY: Preg Test, Ur: NEGATIVE

## 2016-11-28 NOTE — Progress Notes (Signed)
   Patricia Jenkins, is a 36 y.o. female  [id number]  [id number]  DOB - March 02, 1981  Subjective:  Chief Complaint and HPI: Patricia Jenkins is a 36 y.o. female here today to establish care and for a follow up visit.   ED/Hospital notes reviewed.     ROS:   Constitutional:  No f/c, No night sweats, No unexplained weight loss. EENT:  No vision changes, No blurry vision, No hearing changes. No mouth, throat, or ear problems.  Respiratory: No cough, No SOB Cardiac: No CP, no palpitations GI:  No abd pain, No N/V/D. GU: No Urinary s/sx Musculoskeletal: No joint pain Neuro: No headache, no dizziness, no motor weakness.  Skin: No rash Endocrine:  No polydipsia. No polyuria.  Psych: Denies SI/HI  No problems updated.  ALLERGIES: No Known Allergies  PAST MEDICAL HISTORY: Past Medical History:  Diagnosis Date  . Chronic headaches   . Herpes   . Pneumonia 2010    MEDICATIONS AT HOME: Prior to Admission medications   Medication Sig Start Date End Date Taking? Authorizing Provider  cetirizine (ZYRTEC) 10 MG tablet Take 1 tablet (10 mg total) by mouth daily. Patient not taking: Reported on 11/28/2016 09/12/16   Boykin Nearing, MD     Objective:  EXAM:   Vitals:   11/28/16 1451  BP: 118/80  Pulse: 81  Resp: 16  Temp: 98.6 F (37 C)  TempSrc: Oral  Weight: 218 lb 3.2 oz (99 kg)    General appearance : A&OX3. NAD. Non-toxic-appearing HEENT: Atraumatic and Normocephalic.  PERRLA. EOM intact.  TM clear B. Mouth-MMM, post pharynx WNL w/o erythema, No PND. Neck: supple, no JVD. No cervical lymphadenopathy. No thyromegaly Chest/Lungs:  Breathing-non-labored, Good air entry bilaterally, breath sounds normal without rales, rhonchi, or wheezing  CVS: S1 S2 regular, no murmurs, gallops, rubs  Abdomen: Bowel sounds present, Non tender and not distended with no gaurding, rigidity or rebound. Extremities: Bilateral Lower Ext shows no edema, both legs are warm to touch with = pulse  throughout Neurology:  CN II-XII grossly intact, Non focal.   Psych:  TP linear. J/I WNL. Normal speech. Appropriate eye contact and affect.  Skin:  No Rash  Data Review Lab Results  Component Value Date   HGBA1C 5.6 09/12/2016   HGBA1C 6.0 (H) 07/26/2015   HGBA1C 5.7 (H) 06/27/2014     Assessment & Plan   1. Painful bladder spasm  - POCT urinalysis dipstick  2. Vaginal discharge - Cervicovaginal ancillary only  3. Pain in joint involving pelvic region and thigh, unspecified laterality - POCT urine pregnancy  Patient have been counseled extensively about nutrition and exercise    The patient was given clear instructions to go to ER or return to medical center if symptoms don't improve, worsen or new problems develop. The patient verbalized understanding. The patient was told to call to get lab results if they haven't heard anything in the next week.     Freeman Caldron, PA-C [facility] and [place], [place]   11/28/2016, 3:10 PMPatient ID: Patricia Jenkins, female   DOB: 04/15/1981, 36 y.o.   MRN: [id number]

## 2016-11-28 NOTE — Progress Notes (Signed)
Patricia Jenkins, is a 36 y.o. female  [id number]  [id number]  DOB - [birth year]  Subjective:  Chief Complaint and HPI: Patricia Jenkins is a 36 y.o. female here today for about 1 week h/o increased but not abnormal vaginal discharge without odor, frequent urination with the sensation of having a full ladder while only producing small amounts of urine.  No dysuria.  Some pelvic pain worse on L side.  +dyspareunia.  She feels STD are very low risk bc monogamous with partner.  She has had BTL.  First day of LMP was 11/17/2016.  No N/V/D.   ROS:   Constitutional:  No f/c, No night sweats, No unexplained weight loss. EENT:  No vision changes, No blurry vision, No hearing changes. No mouth, throat, or ear problems.  Respiratory: No cough, No SOB Cardiac: No CP, no palpitations GI:  No abd pain, No N/V/D. +plevic pain and dyspareunia GU: +Urinary s/sx Musculoskeletal: No joint pain Neuro: No headache, no dizziness, no motor weakness.  Skin: No rash Endocrine:  No polydipsia. No polyuria.  Psych: Denies SI/HI  No problems updated.  ALLERGIES: No Known Allergies  PAST MEDICAL HISTORY: Past Medical History:  Diagnosis Date  . Chronic headaches   . Herpes   . Pneumonia 2010    MEDICATIONS AT HOME: Prior to Admission medications   Medication Sig Start Date End Date Taking? Authorizing Provider  cetirizine (ZYRTEC) 10 MG tablet Take 1 tablet (10 mg total) by mouth daily. Patient not taking: Reported on 11/28/2016 09/12/16   Boykin Nearing, MD     Objective:  EXAM:   Vitals:   11/28/16 1451  BP: 118/80  Pulse: 81  Resp: 16  Temp: 98.6 F (37 C)  TempSrc: Oral  Weight: 218 lb 3.2 oz (99 kg)    General appearance : A&OX3. NAD. Non-toxic-appearing HEENT: Atraumatic and Normocephalic.  PERRLA. EOM intact.  TM clear B. Mouth-MMM, post pharynx WNL w/o erythema, No PND. Neck: supple, no JVD. No cervical lymphadenopathy. No thyromegaly Chest/Lungs:  Breathing-non-labored,  Good air entry bilaterally, breath sounds normal without rales, rhonchi, or wheezing  CVS: S1 S2 regular, no murmurs, gallops, rubs  Abdomen: Bowel sounds present, Non tender and not distended with no gaurding, rigidity or rebound. Pelvic: Speculum inserted. Vaginal mucosa WNL with normal rugation and appearance.  Cervix WNL-there is clear-white d/c in vault.  Swabs taken.  Mild CMT with neg chandelier sign. Mild TTP over L adnexa without fullness.   Extremities: Bilateral Lower Ext shows no edema, both legs are warm to touch with = pulse throughout Neurology:  CN II-XII grossly intact, Non focal.   Psych:  TP linear. J/I WNL. Normal speech. Appropriate eye contact and affect.  Skin:  No Rash  Data Review Lab Results  Component Value Date   HGBA1C 5.6 09/12/2016   HGBA1C 6.0 (H) 07/26/2015   HGBA1C 5.7 (H) 06/27/2014     Assessment & Plan   1. Painful bladder spasm - POCT urinalysis dipstick=normal  2. Vaginal discharge - Cervicovaginal ancillary only  3. Pain pelvic-?mittelschmerz from L ovary. For now, ibuprofen for discomfort.  To ED if worsens/becomes severe, fever develops - POCT urine pregnancy Discussed covering for STI-deferred per patient as she feels that she is extremely low risk for STI-this is reasonable given the mild nature of the symptoms, no signs of systemic illness including her not having had a fever.     Patient have been counseled extensively about nutrition and exercise  Return if symptoms worsen or  fail to improve.  The patient was given clear instructions to go to ER or return to medical center if symptoms don't improve, worsen or new problems develop. The patient verbalized understanding. The patient was told to call to get lab results if they haven't heard anything in the next week.     Freeman Caldron, PA-C [facility] and [place], [place]   11/28/2016, 3:41 PMPatient ID: Patricia Jenkins, female   DOB:  02-24-1981, 36 y.o.   MRN: [id number]

## 2016-11-29 LAB — CERVICOVAGINAL ANCILLARY ONLY
CHLAMYDIA, DNA PROBE: NEGATIVE
NEISSERIA GONORRHEA: NEGATIVE
Wet Prep (BD Affirm): NEGATIVE

## 2016-12-03 ENCOUNTER — Telehealth: Payer: Self-pay

## 2016-12-03 NOTE — Telephone Encounter (Signed)
Contacted pt to go over lab results pt didn't answer lvm asking pt to give me a call back at her earliest convenience.  If pt calls back please give results   Results: labs were normal. There was no yeast, bacterial vaginosis, trich, chlamydia, or gonorrhea.

## 2016-12-06 ENCOUNTER — Telehealth: Payer: Self-pay

## 2016-12-06 NOTE — Telephone Encounter (Signed)
Contacted pt to go over lab results pt is aware of results and vaginal swabs. Pt doesn't have any questions or concerns

## 2017-12-03 ENCOUNTER — Encounter: Payer: Self-pay | Admitting: Family Medicine

## 2017-12-03 ENCOUNTER — Ambulatory Visit: Payer: Medicaid Other | Attending: Family Medicine | Admitting: Family Medicine

## 2017-12-03 ENCOUNTER — Other Ambulatory Visit (HOSPITAL_COMMUNITY)
Admission: RE | Admit: 2017-12-03 | Discharge: 2017-12-03 | Disposition: A | Discharge status: Home / Self Care | Payer: Medicaid Other | Source: Ambulatory Visit | Attending: Family Medicine | Admitting: Family Medicine

## 2017-12-03 VITALS — BP 122/87 | HR 86 | Temp 98.0°F | Ht 69.0 in | Wt 210.2 lb

## 2017-12-03 DIAGNOSIS — Z0000 Encounter for general adult medical examination without abnormal findings: Secondary | ICD-10-CM

## 2017-12-03 DIAGNOSIS — Z01419 Encounter for gynecological examination (general) (routine) without abnormal findings: Secondary | ICD-10-CM | POA: Insufficient documentation

## 2017-12-03 DIAGNOSIS — Z124 Encounter for screening for malignant neoplasm of cervix: Secondary | ICD-10-CM

## 2017-12-03 DIAGNOSIS — Z13228 Encounter for screening for other metabolic disorders: Secondary | ICD-10-CM | POA: Diagnosis not present

## 2017-12-03 DIAGNOSIS — Z1151 Encounter for screening for human papillomavirus (HPV): Secondary | ICD-10-CM | POA: Insufficient documentation

## 2017-12-03 NOTE — Progress Notes (Signed)
Subjective:  Patient ID: Patricia Jenkins, female    DOB: March 18, 1981  Age: 37 y.o. MRN: [id number]  CC: Annual Exam and Gynecologic Exam   HPI Patricia Jenkins presents for an annual physical exam. She declines STD testing or HIV test.  Past Medical History:  Diagnosis Date  . Chronic headaches   . Herpes   . Pneumonia 2010    Past Surgical History:  Procedure Laterality Date  . CHOLECYSTECTOMY  10/03/2011   Procedure: LAPAROSCOPIC CHOLECYSTECTOMY WITH INTRAOPERATIVE CHOLANGIOGRAM;  Surgeon: Harl Bowie, MD;  Location: [place];  Service: General;  Laterality: N/A;  . DILATION AND CURETTAGE OF UTERUS    . TUBAL LIGATION       No Known Allergies    Outpatient Medications Prior to Visit  Medication Sig Dispense Refill  . cetirizine (ZYRTEC) 10 MG tablet Take 1 tablet (10 mg total) by mouth daily. (Patient not taking: Reported on 11/28/2016) 30 tablet 11   No facility-administered medications prior to visit.     ROS Review of Systems  Constitutional: Negative for activity change, appetite change and fatigue.  HENT: Negative for congestion, sinus pressure and sore throat.   Eyes: Negative for visual disturbance.  Respiratory: Negative for cough, chest tightness, shortness of breath and wheezing.   Cardiovascular: Negative for chest pain and palpitations.  Gastrointestinal: Negative for abdominal distention, abdominal pain and constipation.  Endocrine: Negative for polydipsia.  Genitourinary: Negative for dysuria and frequency.  Musculoskeletal: Negative for arthralgias and back pain.  Skin: Negative for rash.  Neurological: Negative for tremors, light-headedness and numbness.  Hematological: Does not bruise/bleed easily.  Psychiatric/Behavioral: Negative for agitation and behavioral problems.    Objective:  BP 122/87   Pulse 86   Temp 98 F (36.7 C) (Oral)   Ht '5\' 9"'$  (1.753 m)   Wt 210 lb 3.2 oz (95.3 kg)   LMP 11/24/2017   SpO2 100%   BMI 31.04 kg/m    BP/Weight 12/03/2017 11/28/2016 63/89/3734  Systolic BP 287 681 157  Diastolic BP 87 80 82  Wt. (Lbs) 210.2 218.2 227.4  BMI 31.04 32.22 33.58      Physical Exam  Constitutional: She is oriented to person, place, and time. She appears well-developed and well-nourished. No distress.  HENT:  Head: Normocephalic.  Right Ear: External ear normal.  Left Ear: External ear normal.  Nose: Nose normal.  Mouth/Throat: Oropharynx is clear and moist.  Eyes: Conjunctivae and EOM are normal. Pupils are equal, round, and reactive to light.  Neck: Normal range of motion. No JVD present.  Cardiovascular: Normal rate, regular rhythm, normal heart sounds and intact distal pulses. Exam reveals no gallop.  No murmur heard. Pulmonary/Chest: Effort normal and breath sounds normal. No respiratory distress. She has no wheezes. She has no rales. She exhibits no tenderness. Right breast exhibits no mass and no tenderness. Left breast exhibits no mass and no tenderness.  Abdominal: Soft. Bowel sounds are normal. She exhibits no distension and no mass. There is no tenderness.  Genitourinary:  Genitourinary Comments: External genitalia, vagina, cervix, adnexa - normal  Musculoskeletal: Normal range of motion. She exhibits no edema or tenderness.  Neurological: She is alert and oriented to person, place, and time. She has normal reflexes.  Skin: Skin is warm and dry. She is not diaphoretic.  Psychiatric: She has a normal mood and affect.     Assessment & Plan:   1. Annual physical exam Counseled on 150 minutes of exercise per week, healthy eating (  including decreased daily intake of saturated fats, cholesterol, added sugars, sodium), STI prevention, routine healthcare maintenance.  2. Screening for cervical cancer - Cytology - PAP([place])  3. Screening for metabolic disorder - PCH40+BTCY; Future - Lipid panel; Future   No orders of the defined types were placed in this encounter.   Follow-up:  Return in about 1 year (around 12/03/2018) for Annual physical exam.   Arnoldo Morale MD

## 2017-12-03 NOTE — Patient Instructions (Signed)

## 2017-12-04 LAB — CYTOLOGY - PAP
Diagnosis: NEGATIVE
HPV: NOT DETECTED

## 2018-04-29 ENCOUNTER — Ambulatory Visit: Payer: Medicaid Other | Attending: Family Medicine | Admitting: Physician Assistant

## 2018-04-29 ENCOUNTER — Other Ambulatory Visit (HOSPITAL_COMMUNITY)
Admission: RE | Admit: 2018-04-29 | Discharge: 2018-04-29 | Disposition: A | Discharge status: Home / Self Care | Payer: Medicaid Other | Source: Ambulatory Visit | Attending: Family Medicine | Admitting: Family Medicine

## 2018-04-29 VITALS — BP 115/76 | HR 88 | Temp 99.0°F | Resp 18 | Ht 69.0 in

## 2018-04-29 DIAGNOSIS — Z79899 Other long term (current) drug therapy: Secondary | ICD-10-CM | POA: Diagnosis not present

## 2018-04-29 DIAGNOSIS — N898 Other specified noninflammatory disorders of vagina: Secondary | ICD-10-CM | POA: Diagnosis present

## 2018-04-29 DIAGNOSIS — N3000 Acute cystitis without hematuria: Secondary | ICD-10-CM

## 2018-04-29 DIAGNOSIS — N390 Urinary tract infection, site not specified: Secondary | ICD-10-CM | POA: Diagnosis not present

## 2018-04-29 DIAGNOSIS — R300 Dysuria: Secondary | ICD-10-CM | POA: Diagnosis present

## 2018-04-29 DIAGNOSIS — R3911 Hesitancy of micturition: Secondary | ICD-10-CM | POA: Diagnosis present

## 2018-04-29 DIAGNOSIS — B9689 Other specified bacterial agents as the cause of diseases classified elsewhere: Secondary | ICD-10-CM | POA: Insufficient documentation

## 2018-04-29 LAB — POCT URINALYSIS DIPSTICK
BILIRUBIN UA: NEGATIVE
Glucose, UA: NEGATIVE
KETONES UA: NEGATIVE
NITRITE UA: NEGATIVE
PH UA: 7 (ref 5.0–8.0)
PROTEIN UA: NEGATIVE
RBC UA: NEGATIVE
SPEC GRAV UA: 1.015 (ref 1.010–1.025)
UROBILINOGEN UA: 0.2 U/dL

## 2018-04-29 MED ORDER — FLUCONAZOLE 150 MG PO TABS: 150.0000 mg | ORAL_TABLET | Freq: Once | ORAL | 0 refills | Status: AC

## 2018-04-29 MED ORDER — NITROFURANTOIN MONOHYD MACRO 100 MG PO CAPS: 100.0000 mg | ORAL_CAPSULE | Freq: Two times a day (BID) | ORAL | 0 refills | Status: DC

## 2018-04-29 MED ORDER — FLUCONAZOLE 150 MG PO TABS: 150.0000 mg | ORAL_TABLET | Freq: Once | ORAL | 0 refills | Status: DC

## 2018-04-29 NOTE — Patient Instructions (Addendum)

## 2018-04-29 NOTE — Progress Notes (Signed)
Patient ID: ZHURI KRASS, female   DOB: 07-25-1981, 37 y.o.   MRN: [id number]     Reema Chick, is a 37 y.o. female  [id number]  [id number]  DOB - 1981/10/31  Subjective:  Chief Complaint and HPI: Kuulei Kleier is a 37 y.o. female here today Having burning with urination and urinary hesitancy Took 2.5 days of amoxicillin Some mild vaginal discharge and mild odor.  Symptoms present for about 1 week.  No pelvic pain.  No abdominal pain.  No f/c.  No flank pain.  Denies STI risk factors.  H/O HSV.   ROS:   Constitutional:  No f/c, No night sweats, No unexplained weight loss. EENT:  No vision changes, No blurry vision, No hearing changes. No mouth, throat, or ear problems.  Respiratory: No cough, No SOB Cardiac: No CP, no palpitations GI:  No abd pain, No N/V/D. GU: + Urinary s/sx Musculoskeletal: No joint pain Neuro: No headache, no dizziness, no motor weakness.  Skin: No rash Endocrine:  No polydipsia. No polyuria.  Psych: Denies SI/HI  No problems updated.  ALLERGIES: No Known Allergies  PAST MEDICAL HISTORY: Past Medical History:  Diagnosis Date  . Chronic headaches   . Herpes   . Pneumonia 2010    MEDICATIONS AT HOME: Prior to Admission medications   Medication Sig Start Date End Date Taking? Authorizing Provider  cetirizine (ZYRTEC) 10 MG tablet Take 1 tablet (10 mg total) by mouth daily. Patient not taking: Reported on 11/28/2016 09/12/16   Boykin Nearing, MD  fluconazole (DIFLUCAN) 150 MG tablet Take 1 tablet (150 mg total) by mouth once for 1 dose. And one at the end of the antibiotic regimen 04/29/18 04/29/18  Argentina Donovan, PA-C  nitrofurantoin, macrocrystal-monohydrate, (MACROBID) 100 MG capsule Take 1 capsule (100 mg total) by mouth 2 (two) times daily. 04/29/18   Argentina Donovan, PA-C     Objective:  EXAM:   Vitals:   04/29/18 1443  BP: 115/76  Pulse: 88  Resp: 18  Temp: 99 F (37.2 C)  TempSrc: Oral  SpO2: 98%  Height: 5\' 9"  (1.753 m)      General appearance : A&OX3. NAD. Non-toxic-appearing HEENT: Atraumatic and Normocephalic.  PERRLA. EOM intact.  Neck: supple, no JVD. No cervical lymphadenopathy. No thyromegaly Chest/Lungs:  Breathing-non-labored, Good air entry bilaterally, breath sounds normal without rales, rhonchi, or wheezing  CVS: S1 S2 regular, no murmurs, gallops, rubs  Extremities: Bilateral Lower Ext shows no edema, both legs are warm to touch with = pulse throughout Neurology:  CN II-XII grossly intact, Non focal.   Psych:  TP linear. J/I WNL. Normal speech. Appropriate eye contact and affect.  Skin:  No Rash  Data Review Lab Results  Component Value Date   HGBA1C 5.6 09/12/2016   HGBA1C 6.0 (H) 07/26/2015   HGBA1C 5.7 (H) 06/27/2014     Assessment & Plan   1. Acute cystitis without hematuria - Urine Culture - Urine cytology ancillary only - nitrofurantoin, macrocrystal-monohydrate, (MACROBID) 100 MG capsule; Take 1 capsule (100 mg total) by mouth 2 (two) times daily.  Dispense: 10 capsule; Refill: 0 - fluconazole (DIFLUCAN) 150 MG tablet; Take 1 tablet (150 mg total) by mouth once for 1 dose. And one at the end of the antibiotic regimen  Dispense: 1 tablet; Refill: 0       Patient have been counseled extensively about nutrition and exercise  F/up prn  The patient was given clear instructions to go to ER or return to medical  center if symptoms don't improve, worsen or new problems develop. The patient verbalized understanding. The patient was told to call to get lab results if they haven't heard anything in the next week.     Freeman Caldron, PA-C [facility] and [place], [place]   04/29/2018, 2:53 PM

## 2018-04-30 LAB — URINE CYTOLOGY ANCILLARY ONLY
Chlamydia: NEGATIVE
NEISSERIA GONORRHEA: NEGATIVE
Trichomonas: NEGATIVE

## 2018-05-04 LAB — URINE CYTOLOGY ANCILLARY ONLY
Bacterial vaginitis: NEGATIVE
CANDIDA VAGINITIS: NEGATIVE

## 2018-05-06 ENCOUNTER — Telehealth: Payer: Self-pay | Admitting: *Deleted

## 2018-05-06 NOTE — Telephone Encounter (Signed)
-----   Message from Argentina Donovan, [place] sent at 05/06/2018  9:09 AM EDT ----- Urine showed no vaginal problem or infection.  Follow up if needed.  Thanks, Freeman Caldron, PA-C

## 2018-05-06 NOTE — Telephone Encounter (Signed)
Patient verified DOB Patient is aware of no vaginal concerns or infection being noted and to follow up as needed. No further questions.

## 2018-09-07 ENCOUNTER — Ambulatory Visit (HOSPITAL_COMMUNITY)
Admission: EM | Admit: 2018-09-07 | Discharge: 2018-09-07 | Disposition: A | Discharge status: Home / Self Care | Payer: Medicaid Other | Attending: Family Medicine | Admitting: Family Medicine

## 2018-09-07 ENCOUNTER — Other Ambulatory Visit: Payer: Self-pay

## 2018-09-07 ENCOUNTER — Encounter (HOSPITAL_COMMUNITY): Payer: Self-pay | Admitting: *Deleted

## 2018-09-07 DIAGNOSIS — M25532 Pain in left wrist: Secondary | ICD-10-CM | POA: Diagnosis not present

## 2018-09-07 MED ORDER — DICLOFENAC SODIUM 1 % TD GEL: 2.0000 g | Freq: Four times a day (QID) | TRANSDERMAL | 0 refills | Status: DC

## 2018-09-07 MED ORDER — MELOXICAM 7.5 MG PO TABS: 7.5000 mg | ORAL_TABLET | Freq: Every day | ORAL | 0 refills | Status: DC

## 2018-09-07 NOTE — ED Provider Notes (Signed)
[facility]    CSN: [id number] Arrival date & time: 09/07/18  1339     History   Chief Complaint Chief Complaint  Patient presents with  . Wrist Pain    HPI Patricia Jenkins is a 37 y.o. female.   37 year old female comes in for 39-month history of left wrist pain.  States first started around the ulnar aspect, pain is intermittent, but pain has been gradually worsened and now is diffuse throughout the wrist.  Pain now can radiate up the arm to the elbow.  Worsens with movement.  Denies numbness, tingling of the fingers.  Denies injury/trauma.  Has been taking ibuprofen without relief.  Using wrist splint without improvement.  Patient work requires heavy lifting, repetitive motion.     Past Medical History:  Diagnosis Date  . Chronic headaches   . Herpes   . Pneumonia 2010    Patient Active Problem List   Diagnosis Date Noted  . Acute non-recurrent sinusitis 09/14/2016  . Menorrhagia with regular cycle 09/14/2016  . 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 Biliary colic 10/62/6948  . Cholelithiasis 10/02/2011  . CONSTIPATION 01/15/2007  . CERVICAL DYSPLASIA 01/15/2007    Past Surgical History:  Procedure Laterality Date  . CHOLECYSTECTOMY  10/03/2011   Procedure: LAPAROSCOPIC CHOLECYSTECTOMY WITH INTRAOPERATIVE CHOLANGIOGRAM;  Surgeon: Harl Bowie, MD;  Location: [place];  Service: General;  Laterality: N/A;  . DILATION AND CURETTAGE OF UTERUS    . TUBAL LIGATION      OB History    Gravida  4   Para  3   Term  3   Preterm      AB  1   Living  3     SAB  1   TAB      Ectopic      Multiple      Live Births  3            Home Medications    Prior to Admission medications   Medication Sig Start Date End Date Taking? Authorizing Provider  cetirizine (ZYRTEC) 10 MG tablet Take 1 tablet (10 mg total) by mouth daily. Patient not taking: Reported on 11/28/2016 09/12/16   Boykin Nearing, MD    diclofenac sodium (VOLTAREN) 1 % GEL Apply 2 g topically 4 (four) times daily. 09/07/18   Tasia Catchings, Amy V, PA-C  meloxicam (MOBIC) 7.5 MG tablet Take 1 tablet (7.5 mg total) by mouth daily. 09/07/18   Tasia Catchings, Amy V, PA-C  nitrofurantoin, macrocrystal-monohydrate, (MACROBID) 100 MG capsule Take 1 capsule (100 mg total) by mouth 2 (two) times daily. 04/29/18   Argentina Donovan, PA-C    Family History Family History  Problem Relation Age of Onset  . Hypertension Mother   . Hypothyroidism Mother   . Diabetes Father   . Hypertension Father     Social History Social History   Tobacco Use  . Smoking status: Former Research scientist (life sciences)  . Smokeless tobacco: Never Used  Substance Use Topics  . Alcohol use: Not Currently    Comment: occasionally  . Drug use: No     Allergies   Patient has no known allergies.   Review of Systems Review of Systems  Reason unable to perform ROS: See HPI as above.     Physical Exam Triage Vital Signs ED Triage Vitals  Enc Vitals Group     BP 09/07/18 1453 120/61     Pulse Rate 09/07/18 1453 77  Resp 09/07/18 1453 16     Temp 09/07/18 1453 98.2 F (36.8 C)     Temp Source 09/07/18 1453 Oral     SpO2 09/07/18 1453 100 %     Weight --      Height --      Head Circumference --      Peak Flow --      Pain Score 09/07/18 1455 7     Pain Loc --      Pain Edu? --      Excl. in [place]? --    No data found.  Updated Vital Signs BP 120/61 (BP Location: Right Arm)   Pulse 77   Temp 98.2 F (36.8 C) (Oral)   Resp 16   LMP 08/16/2018 (Exact Date)   SpO2 100%   Physical Exam  Constitutional: She is oriented to person, place, and time. She appears well-developed and well-nourished. No distress.  HENT:  Head: Normocephalic and atraumatic.  Eyes: Pupils are equal, round, and reactive to light. Conjunctivae are normal.  Musculoskeletal:  No swelling, erythema, increased warmth, contusion seen.  Diffuse tenderness to palpation of left wrist.  No tenderness to  palpation of elbow and shoulder.  Full range of motion of wrist, though with pain.  Strength deferred.  Sensation intact and equal bilaterally.  Radial pulse 2+ and equal bilaterally.  Cap refill less than 2 seconds.  Deferred Tinel and Phalen's due to patient's pain.  Neurological: She is alert and oriented to person, place, and time.  Skin: She is not diaphoretic.     UC Treatments / Results  Labs (all labs ordered are listed, but only abnormal results are displayed) Labs Reviewed - No data to display  EKG None  Radiology No results found.  Procedures Procedures (including critical care time)  Medications Ordered in UC Medications - No data to display  Initial Impression / Assessment and Plan / UC Course  I have reviewed the triage vital signs and the nursing notes.  Pertinent labs & imaging results that were available during my care of the patient were reviewed by me and considered in my medical decision making (see chart for details).    Discussed possible tendinitis causing symptoms.  Patient would like to defer prednisone at this time, stating gets headaches with prednisone. Start mobic as directed, voltaren gel as directed. Ice compress, rest, wrist splint during activity.  Final Clinical Impressions(s) / UC Diagnoses   Final diagnoses:  Left wrist pain    ED Prescriptions    Medication Sig Dispense Auth. Provider   meloxicam (MOBIC) 7.5 MG tablet Take 1 tablet (7.5 mg total) by mouth daily. 15 tablet Yu, Amy V, PA-C   diclofenac sodium (VOLTAREN) 1 % GEL Apply 2 g topically 4 (four) times daily. 1 Tube Tobin Chad, PA-C 09/07/18 1552

## 2018-09-07 NOTE — ED Triage Notes (Signed)
C/o left wrist pain onset 2 months ago ,states the pain is getting worse.

## 2018-09-07 NOTE — Discharge Instructions (Signed)
Start mobic as directed. Ice compress, rest, wrist splint during activity. Follow up with PCP if symptoms not improving. I have also attached orthopedic information for follow up as needed.

## 2019-05-20 DIAGNOSIS — H5213 Myopia, bilateral: Secondary | ICD-10-CM | POA: Diagnosis not present

## 2019-05-20 DIAGNOSIS — H52229 Regular astigmatism, unspecified eye: Secondary | ICD-10-CM | POA: Diagnosis not present

## 2019-07-28 ENCOUNTER — Ambulatory Visit: Payer: Medicaid Other | Admitting: Family Medicine

## 2019-11-22 ENCOUNTER — Other Ambulatory Visit: Payer: Self-pay

## 2019-11-22 ENCOUNTER — Encounter: Payer: Self-pay | Admitting: Emergency Medicine

## 2019-11-22 ENCOUNTER — Ambulatory Visit
Admission: EM | Admit: 2019-11-22 | Discharge: 2019-11-22 | Disposition: A | Discharge status: Home / Self Care | Payer: Managed Care, Other (non HMO) | Attending: Emergency Medicine | Admitting: Emergency Medicine

## 2019-11-22 DIAGNOSIS — R110 Nausea: Secondary | ICD-10-CM

## 2019-11-22 DIAGNOSIS — Z0189 Encounter for other specified special examinations: Secondary | ICD-10-CM

## 2019-11-22 DIAGNOSIS — R519 Headache, unspecified: Secondary | ICD-10-CM

## 2019-11-22 DIAGNOSIS — R0602 Shortness of breath: Secondary | ICD-10-CM

## 2019-11-22 NOTE — ED Triage Notes (Signed)
Patient in office today c/o Headache,nausea,sob with chest pain x 2wks  [id number]

## 2019-11-22 NOTE — ED Provider Notes (Signed)
Roderic Palau    CSN: [id number] Arrival date & time: 11/22/19  1214      History   Chief Complaint Chief Complaint  Patient presents with  . Headache  . Nausea  . Covid test    HPI Terin Tra is a 39 y.o. female.   Patient presents with request for COVID test.  She reports a 2-week history of intermittent headache, nausea, shortness of breath, chest pain.  She has attempted treatment at home with Mucinex.  She denies fever, chills, sore throat, cough, vomiting, diarrhea, rash, or other symptoms.    The history is provided by the patient.    History reviewed. No pertinent past medical history.  There are no problems to display for this patient.   History reviewed. No pertinent surgical history.  OB History   No obstetric history on file.      Home Medications    Prior to Admission medications   Not on File    Family History History reviewed. No pertinent family history.  Social History Social History   Tobacco Use  . Smoking status: Never Smoker  . Smokeless tobacco: Never Used  Substance Use Topics  . Alcohol use: Not on file  . Drug use: Not on file     Allergies   Ambien [zolpidem]   Review of Systems Review of Systems  Constitutional: Negative for chills and fever.  HENT: Negative for congestion, ear pain, rhinorrhea and sore throat.   Eyes: Negative for pain and visual disturbance.  Respiratory: Positive for shortness of breath. Negative for cough.   Cardiovascular: Positive for chest pain. Negative for palpitations.  Gastrointestinal: Positive for nausea. Negative for abdominal pain, diarrhea and vomiting.  Genitourinary: Negative for dysuria and hematuria.  Musculoskeletal: Negative for arthralgias and back pain.  Skin: Negative for color change and rash.  Neurological: Positive for headaches. Negative for seizures and syncope.  All other systems reviewed and are negative.    Physical Exam Triage Vital Signs ED Triage  Vitals  Enc Vitals Group     BP 11/22/19 1229 123/87     Pulse Rate 11/22/19 1229 87     Resp 11/22/19 1229 16     Temp 11/22/19 1229 98.3 F (36.8 C)     Temp Source 11/22/19 1229 Oral     SpO2 11/22/19 1229 97 %     Weight 11/22/19 1258 218 lb (98.9 kg)     Height --      Head Circumference --      Peak Flow --      Pain Score 11/22/19 1258 10     Pain Loc --      Pain Edu? --      Excl. in [place]? --    No data found.  Updated Vital Signs BP 123/87 (BP Location: Left Arm)   Pulse 87   Temp 98.3 F (36.8 C) (Oral)   Resp 16   Wt 218 lb (98.9 kg)   LMP 11/15/2019   SpO2 97%   Visual Acuity Right Eye Distance:   Left Eye Distance:   Bilateral Distance:    Right Eye Near:   Left Eye Near:    Bilateral Near:     Physical Exam Vitals and nursing note reviewed.  Constitutional:      General: She is not in acute distress.    Appearance: She is well-developed. She is not ill-appearing.  HENT:     Head: Normocephalic and atraumatic.     Right  Ear: Tympanic membrane and ear canal normal.     Left Ear: Tympanic membrane and ear canal normal.     Nose: Nose normal.     Mouth/Throat:     Mouth: Mucous membranes are moist.     Pharynx: Oropharynx is clear.  Eyes:     Conjunctiva/sclera: Conjunctivae normal.  Cardiovascular:     Rate and Rhythm: Normal rate and regular rhythm.     Heart sounds: No murmur.  Pulmonary:     Effort: Pulmonary effort is normal. No respiratory distress.     Breath sounds: Normal breath sounds.  Abdominal:     General: Bowel sounds are normal.     Palpations: Abdomen is soft.     Tenderness: There is no abdominal tenderness. There is no guarding or rebound.  Musculoskeletal:     Cervical back: Neck supple.  Skin:    General: Skin is warm and dry.     Findings: No rash.  Neurological:     General: No focal deficit present.     Mental Status: She is alert and oriented to person, place, and time.     Sensory: No sensory deficit.      Motor: No weakness.     Gait: Gait normal.  Psychiatric:        Mood and Affect: Mood normal.        Behavior: Behavior normal.      UC Treatments / Results  Labs (all labs ordered are listed, but only abnormal results are displayed) Labs Reviewed  NOVEL CORONAVIRUS, NAA    EKG   Radiology No results found.  Procedures Procedures (including critical care time)  Medications Ordered in UC Medications - No data to display  Initial Impression / Assessment and Plan / UC Course  I have reviewed the triage vital signs and the nursing notes.  Pertinent labs & imaging results that were available during my care of the patient were reviewed by me and considered in my medical decision making (see chart for details).    Patient request for COVID test.  Headache.  Patient declines EKG for chest discomfort; she states she just wants the COVID test.  COVID test performed here.  Instructed patient to self quarantine until the test result is back.  Instructed patient to go to the emergency department if she develops high fever, shortness of breath, severe diarrhea, or other concerning symptoms.  Patient agrees with plan of care.   Final Clinical Impressions(s) / UC Diagnoses   Final diagnoses:  Patient request for diagnostic testing  Acute nonintractable headache, unspecified headache type     Discharge Instructions     Your COVID test is pending.  You should self quarantine until your test result is back and is negative.    Go to the emergency department if you develop high fever, shortness of breath, severe diarrhea, or other concerning symptoms.       ED Prescriptions    None     PDMP not reviewed this encounter.   Sharion Balloon, NP 11/22/19 1318

## 2019-11-22 NOTE — Discharge Instructions (Addendum)
Your COVID test is pending.  You should self quarantine until your test result is back and is negative.   ° °Go to the emergency department if you develop high fever, shortness of breath, severe diarrhea, or other concerning symptoms.   ° °

## 2019-11-23 LAB — NOVEL CORONAVIRUS, NAA: SARS-CoV-2, NAA: NOT DETECTED

## 2019-11-24 ENCOUNTER — Encounter (HOSPITAL_COMMUNITY): Payer: Self-pay | Admitting: *Deleted

## 2020-07-06 ENCOUNTER — Encounter: Payer: 59 | Admitting: Physician Assistant

## 2020-08-08 ENCOUNTER — Other Ambulatory Visit: Payer: Self-pay

## 2020-08-08 ENCOUNTER — Ambulatory Visit: Payer: Managed Care, Other (non HMO) | Attending: Physician Assistant | Admitting: Family

## 2020-08-08 ENCOUNTER — Encounter: Payer: Self-pay | Admitting: Family

## 2020-08-08 VITALS — BP 120/84 | HR 83 | Temp 97.3°F | Resp 16 | Ht 69.0 in | Wt 233.0 lb

## 2020-08-08 DIAGNOSIS — R61 Generalized hyperhidrosis: Secondary | ICD-10-CM | POA: Diagnosis not present

## 2020-08-08 NOTE — Progress Notes (Signed)
New onset of night sweats x months, concerned about hormones

## 2020-08-08 NOTE — Progress Notes (Signed)
Patient ID: Patricia Jenkins, female    DOB: [birth year]  MRN: [id number]  CC: Night Sweats  Subjective: Patricia Jenkins is a 39 y.o. female with history of cholelithiasis, cervical dysplasia, and biliary colic who presents for night sweats.    1. NIGHT SWEATS: Reports she does not have too many concerns regarding night sweats. Reports her mother did start menopause in her mid-forties and she wondered if she could possibly be progressing towards early menopause as well. Reports she is still having periods.      Patient Active Problem List   Diagnosis Date Noted  . Acute non-recurrent sinusitis 09/14/2016  . Menorrhagia with regular cycle 09/14/2016  . 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 Biliary colic 40/98/1191  . Cholelithiasis 10/02/2011  . CONSTIPATION 01/15/2007  . CERVICAL DYSPLASIA 01/15/2007     Current Outpatient Medications on File Prior to Visit  Medication Sig Dispense Refill  . cetirizine (ZYRTEC) 10 MG tablet Take 1 tablet (10 mg total) by mouth daily. (Patient not taking: Reported on 11/28/2016) 30 tablet 11  . diclofenac sodium (VOLTAREN) 1 % GEL Apply 2 g topically 4 (four) times daily. (Patient not taking: Reported on 08/08/2020) 1 Tube 0  . meloxicam (MOBIC) 7.5 MG tablet Take 1 tablet (7.5 mg total) by mouth daily. (Patient not taking: Reported on 08/08/2020) 15 tablet 0  . nitrofurantoin, macrocrystal-monohydrate, (MACROBID) 100 MG capsule Take 1 capsule (100 mg total) by mouth 2 (two) times daily. (Patient not taking: Reported on 08/08/2020) 10 capsule 0   No current facility-administered medications on file prior to visit.    Allergies  Allergen Reactions  . Ambien [Zolpidem]     Social History   Socioeconomic History  . Marital status: Married    Spouse name: Not on file  . Number of children: Not on file  . Years of education: Not on file  . Highest education level: Not on file  Occupational History  .  Not on file  Tobacco Use  . Smoking status: Never Smoker  . Smokeless tobacco: Never Used  Substance and Sexual Activity  . Alcohol use: Not Currently    Comment: occasionally  . Drug use: No  . Sexual activity: Yes    Partners: Male    Birth control/protection: Surgical    Comment: Tubal Ligation   Other Topics Concern  . Not on file  Social History Narrative   ** Merged History Encounter **       Social Determinants of Health   Financial Resource Strain:   . Difficulty of Paying Living Expenses: Not on file  Food Insecurity:   . Worried About Charity fundraiser in the Last Year: Not on file  . Ran Out of Food in the Last Year: Not on file  Transportation Needs:   . Lack of Transportation (Medical): Not on file  . Lack of Transportation (Non-Medical): Not on file  Physical Activity:   . Days of Exercise per Week: Not on file  . Minutes of Exercise per Session: Not on file  Stress:   . Feeling of Stress : Not on file  Social Connections:   . Frequency of Communication with Friends and Family: Not on file  . Frequency of Social Gatherings with Friends and Family: Not on file  . Attends Religious Services: Not on file  . Active Member of Clubs or Organizations: Not on file  . Attends Archivist Meetings: Not on file  .  Marital Status: Not on file  Intimate Partner Violence:   . Fear of Current or Ex-Partner: Not on file  . Emotionally Abused: Not on file  . Physically Abused: Not on file  . Sexually Abused: Not on file    Family History  Problem Relation Age of Onset  . Hypertension Mother   . Hypothyroidism Mother   . Diabetes Father   . Hypertension Father     Past Surgical History:  Procedure Laterality Date  . CHOLECYSTECTOMY  10/03/2011   Procedure: LAPAROSCOPIC CHOLECYSTECTOMY WITH INTRAOPERATIVE CHOLANGIOGRAM;  Surgeon: Harl Bowie, MD;  Location: [facility];  Service: General;  Laterality: N/A;  . DILATION AND CURETTAGE OF UTERUS    .  TUBAL LIGATION      ROS: Review of Systems Negative except as stated above  PHYSICAL EXAM: BP 120/84 (BP Location: Left Arm, Patient Position: Sitting, Cuff Size: Large)   Pulse 83   Temp (!) 97.3 F (36.3 C) (Temporal)   Resp 16   Ht 5\' 9"  (1.753 m)   Wt 233 lb (105.7 kg)   LMP 07/18/2020 (Exact Date)   SpO2 100%   BMI 34.41 kg/m   Physical Exam General appearance - alert, well appearing, and in no distress and oriented to person, place, and time Mental status - alert, oriented to person, place, and time, normal mood, behavior, speech, dress, motor activity, and thought processes Chest - clear to auscultation, no wheezes, rales or rhonchi, symmetric air entry, no tachypnea, retractions or cyanosis Heart - normal rate, regular rhythm, normal S1, S2, no murmurs, rubs, clicks or gallops Neurological - alert, oriented, normal speech, no focal findings or movement disorder noted  ASSESSMENT AND PLAN: 1. Night sweats: - Patient with concern for night sweats. Family history of early menopause. Reports she is currently still have menstrual cycles. - No pharmacological therapy at this time. - Follow-up with primary physician as needed. - Hot flashes and night sweats education:   Do not use any products that contain nicotine or tobacco, such as cigarettes and e-cigarettes. If you need help quitting, ask your health care provider.  Get at least 30 minutes of physical activity on 5 or more days each week.  Avoid alcoholic and caffeinated beverages, as well as spicy foods. This may help prevent hot flashes.  Get 7-8 hours of sleep each night.  If you have hot flashes, try: ? Dressing in layers. ? Avoiding things that may trigger hot flashes, such as spicy food, warm places, or stress. ? Taking slow, deep breaths when a hot flash starts. ? Keeping a fan in your home and office.  Patient was given the opportunity to ask questions.  Patient verbalized understanding of the plan and  was able to repeat key elements of the plan. Patient was given clear instructions to go to Emergency Department or return to medical center if symptoms don't improve, worsen, or new problems develop.The patient verbalized understanding.   Camillia Herter, NP

## 2020-08-08 NOTE — Patient Instructions (Addendum)
Follow-up within 1 month to establish care with primary physician and physical exam. Health Maintenance, Female Adopting a healthy lifestyle and getting preventive care are important in promoting health and wellness. Ask your health care provider about:  The right schedule for you to have regular tests and exams.  Things you can do on your own to prevent diseases and keep yourself healthy. What should I know about diet, weight, and exercise? Eat a healthy diet   Eat a diet that includes plenty of vegetables, fruits, low-fat dairy products, and lean protein.  Do not eat a lot of foods that are high in solid fats, added sugars, or sodium. Maintain a healthy weight Body mass index (BMI) is used to identify weight problems. It estimates body fat based on height and weight. Your health care provider can help determine your BMI and help you achieve or maintain a healthy weight. Get regular exercise Get regular exercise. This is one of the most important things you can do for your health. Most adults should:  Exercise for at least 150 minutes each week. The exercise should increase your heart rate and make you sweat (moderate-intensity exercise).  Do strengthening exercises at least twice a week. This is in addition to the moderate-intensity exercise.  Spend less time sitting. Even light physical activity can be beneficial. Watch cholesterol and blood lipids Have your blood tested for lipids and cholesterol at 41 years of age, then have this test every 5 years. Have your cholesterol levels checked more often if:  Your lipid or cholesterol levels are high.  You are older than 41 years of age.  You are at high risk for heart disease. What should I know about cancer screening? Depending on your health history and family history, you may need to have cancer screening at various ages. This may include screening for:  Breast cancer.  Cervical cancer.  Colorectal cancer.  Skin  cancer.  Lung cancer. What should I know about heart disease, diabetes, and high blood pressure? Blood pressure and heart disease  High blood pressure causes heart disease and increases the risk of stroke. This is more likely to develop in people who have high blood pressure readings, are of African descent, or are overweight.  Have your blood pressure checked: ? Every 3-5 years if you are 54-21 years of age. ? Every year if you are 37 years old or older. Diabetes Have regular diabetes screenings. This checks your fasting blood sugar level. Have the screening done:  Once every three years after age 9 if you are at a normal weight and have a low risk for diabetes.  More often and at a younger age if you are overweight or have a high risk for diabetes. What should I know about preventing infection? Hepatitis B If you have a higher risk for hepatitis B, you should be screened for this virus. Talk with your health care provider to find out if you are at risk for hepatitis B infection. Hepatitis C Testing is recommended for:  Everyone born from [birth year] through 1967.  Anyone with known risk factors for hepatitis C. Sexually transmitted infections (STIs)  Get screened for STIs, including gonorrhea and chlamydia, if: ? You are sexually active and are younger than 41 years of age. ? You are older than 41 years of age and your health care provider tells you that you are at risk for this type of infection. ? Your sexual activity has changed since you were last screened, and you are  at increased risk for chlamydia or gonorrhea. Ask your health care provider if you are at risk.  Ask your health care provider about whether you are at high risk for HIV. Your health care provider may recommend a prescription medicine to help prevent HIV infection. If you choose to take medicine to prevent HIV, you should first get tested for HIV. You should then be tested every 3 months for as long as you are taking  the medicine. Pregnancy  If you are about to stop having your period (premenopausal) and you may become pregnant, seek counseling before you get pregnant.  Take 400 to 800 micrograms (mcg) of folic acid every day if you become pregnant.  Ask for birth control (contraception) if you want to prevent pregnancy. Osteoporosis and menopause Osteoporosis is a disease in which the bones lose minerals and strength with aging. This can result in bone fractures. If you are 78 years old or older, or if you are at risk for osteoporosis and fractures, ask your health care provider if you should:  Be screened for bone loss.  Take a calcium or vitamin D supplement to lower your risk of fractures.  Be given hormone replacement therapy (HRT) to treat symptoms of menopause. Follow these instructions at home: Lifestyle  Do not use any products that contain nicotine or tobacco, such as cigarettes, e-cigarettes, and chewing tobacco. If you need help quitting, ask your health care provider.  Do not use street drugs.  Do not share needles.  Ask your health care provider for help if you need support or information about quitting drugs. Alcohol use  Do not drink alcohol if: ? Your health care provider tells you not to drink. ? You are pregnant, may be pregnant, or are planning to become pregnant.  If you drink alcohol: ? Limit how much you use to 0-1 drink a day. ? Limit intake if you are breastfeeding.  Be aware of how much alcohol is in your drink. In the [place] one drink equals one 12 oz bottle of beer (355 mL), one 5 oz glass of wine (148 mL), or one 1 oz glass of hard liquor (44 mL). General instructions  Schedule regular health, dental, and eye exams.  Stay current with your vaccines.  Tell your health care provider if: ? You often feel depressed. ? You have ever been abused or do not feel safe at home. Summary  Adopting a healthy lifestyle and getting preventive care are important in  promoting health and wellness.  Follow your health care provider's instructions about healthy diet, exercising, and getting tested or screened for diseases.  Follow your health care provider's instructions on monitoring your cholesterol and blood pressure. This information is not intended to replace advice given to you by your health care provider. Make sure you discuss any questions you have with your health care provider. Document Revised: 10/28/2018 Document Reviewed: 10/28/2018 Elsevier Patient Education  2020 [employer].

## 2020-09-27 ENCOUNTER — Ambulatory Visit: Payer: Managed Care, Other (non HMO) | Admitting: Family Medicine

## 2021-01-09 ENCOUNTER — Encounter: Payer: Self-pay | Admitting: Family Medicine

## 2021-01-09 ENCOUNTER — Other Ambulatory Visit: Payer: Self-pay

## 2021-01-09 ENCOUNTER — Ambulatory Visit: Payer: Managed Care, Other (non HMO) | Attending: Family Medicine | Admitting: Family Medicine

## 2021-01-09 VITALS — BP 115/76 | HR 98 | Ht 69.0 in | Wt 232.6 lb

## 2021-01-09 DIAGNOSIS — Z1231 Encounter for screening mammogram for malignant neoplasm of breast: Secondary | ICD-10-CM

## 2021-01-09 DIAGNOSIS — Z13228 Encounter for screening for other metabolic disorders: Secondary | ICD-10-CM | POA: Diagnosis not present

## 2021-01-09 DIAGNOSIS — Z1159 Encounter for screening for other viral diseases: Secondary | ICD-10-CM

## 2021-01-09 DIAGNOSIS — Z0000 Encounter for general adult medical examination without abnormal findings: Secondary | ICD-10-CM

## 2021-01-09 NOTE — Patient Instructions (Signed)
Health Maintenance, Female Adopting a healthy lifestyle and getting preventive care are important in promoting health and wellness. Ask your health care provider about:  The right schedule for you to have regular tests and exams.  Things you can do on your own to prevent diseases and keep yourself healthy. What should I know about diet, weight, and exercise? Eat a healthy diet  Eat a diet that includes plenty of vegetables, fruits, low-fat dairy products, and lean protein.  Do not eat a lot of foods that are high in solid fats, added sugars, or sodium.   Maintain a healthy weight Body mass index (BMI) is used to identify weight problems. It estimates body fat based on height and weight. Your health care provider can help determine your BMI and help you achieve or maintain a healthy weight. Get regular exercise Get regular exercise. This is one of the most important things you can do for your health. Most adults should:  Exercise for at least 150 minutes each week. The exercise should increase your heart rate and make you sweat (moderate-intensity exercise).  Do strengthening exercises at least twice a week. This is in addition to the moderate-intensity exercise.  Spend less time sitting. Even light physical activity can be beneficial. Watch cholesterol and blood lipids Have your blood tested for lipids and cholesterol at 42 years of age, then have this test every 5 years. Have your cholesterol levels checked more often if:  Your lipid or cholesterol levels are high.  You are older than 42 years of age.  You are at high risk for heart disease. What should I know about cancer screening? Depending on your health history and family history, you may need to have cancer screening at various ages. This may include screening for:  Breast cancer.  Cervical cancer.  Colorectal cancer.  Skin cancer.  Lung cancer. What should I know about heart disease, diabetes, and high blood  pressure? Blood pressure and heart disease  High blood pressure causes heart disease and increases the risk of stroke. This is more likely to develop in people who have high blood pressure readings, are of African descent, or are overweight.  Have your blood pressure checked: ? Every 3-5 years if you are 18-39 years of age. ? Every year if you are 40 years old or older. Diabetes Have regular diabetes screenings. This checks your fasting blood sugar level. Have the screening done:  Once every three years after age 40 if you are at a normal weight and have a low risk for diabetes.  More often and at a younger age if you are overweight or have a high risk for diabetes. What should I know about preventing infection? Hepatitis B If you have a higher risk for hepatitis B, you should be screened for this virus. Talk with your health care provider to find out if you are at risk for hepatitis B infection. Hepatitis C Testing is recommended for:  Everyone born from 1945 through 1965.  Anyone with known risk factors for hepatitis C. Sexually transmitted infections (STIs)  Get screened for STIs, including gonorrhea and chlamydia, if: ? You are sexually active and are younger than 42 years of age. ? You are older than 42 years of age and your health care provider tells you that you are at risk for this type of infection. ? Your sexual activity has changed since you were last screened, and you are at increased risk for chlamydia or gonorrhea. Ask your health care provider   if you are at risk.  Ask your health care provider about whether you are at high risk for HIV. Your health care provider may recommend a prescription medicine to help prevent HIV infection. If you choose to take medicine to prevent HIV, you should first get tested for HIV. You should then be tested every 3 months for as long as you are taking the medicine. Pregnancy  If you are about to stop having your period (premenopausal) and  you may become pregnant, seek counseling before you get pregnant.  Take 400 to 800 micrograms (mcg) of folic acid every day if you become pregnant.  Ask for birth control (contraception) if you want to prevent pregnancy. Osteoporosis and menopause Osteoporosis is a disease in which the bones lose minerals and strength with aging. This can result in bone fractures. If you are 65 years old or older, or if you are at risk for osteoporosis and fractures, ask your health care provider if you should:  Be screened for bone loss.  Take a calcium or vitamin D supplement to lower your risk of fractures.  Be given hormone replacement therapy (HRT) to treat symptoms of menopause. Follow these instructions at home: Lifestyle  Do not use any products that contain nicotine or tobacco, such as cigarettes, e-cigarettes, and chewing tobacco. If you need help quitting, ask your health care provider.  Do not use street drugs.  Do not share needles.  Ask your health care provider for help if you need support or information about quitting drugs. Alcohol use  Do not drink alcohol if: ? Your health care provider tells you not to drink. ? You are pregnant, may be pregnant, or are planning to become pregnant.  If you drink alcohol: ? Limit how much you use to 0-1 drink a day. ? Limit intake if you are breastfeeding.  Be aware of how much alcohol is in your drink. In the [place] one drink equals one 12 oz bottle of beer (355 mL), one 5 oz glass of wine (148 mL), or one 1 oz glass of hard liquor (44 mL). General instructions  Schedule regular health, dental, and eye exams.  Stay current with your vaccines.  Tell your health care provider if: ? You often feel depressed. ? You have ever been abused or do not feel safe at home. Summary  Adopting a healthy lifestyle and getting preventive care are important in promoting health and wellness.  Follow your health care provider's instructions about healthy  diet, exercising, and getting tested or screened for diseases.  Follow your health care provider's instructions on monitoring your cholesterol and blood pressure. This information is not intended to replace advice given to you by your health care provider. Make sure you discuss any questions you have with your health care provider. Document Revised: 10/28/2018 Document Reviewed: 10/28/2018 Elsevier Patient Education  2021 Elsevier Inc.  

## 2021-01-09 NOTE — Progress Notes (Signed)
Subjective:  Patient ID: Patricia Jenkins, female    DOB: 1981-09-20  Age: 40 y.o. MRN: [id number]  CC: Annual Exam   HPI Patricia Jenkins is a 40 year old female who presents for an annual physical exam.  She would like a full set of blood work and is fasting in anticipation of labs today. She has no concerns today.  Past Medical History:  Diagnosis Date  . Chronic headaches   . Herpes   . Pneumonia 2010    Past Surgical History:  Procedure Laterality Date  . CHOLECYSTECTOMY  10/03/2011   Procedure: LAPAROSCOPIC CHOLECYSTECTOMY WITH INTRAOPERATIVE CHOLANGIOGRAM;  Surgeon: Harl Bowie, MD;  Location: [facility];  Service: General;  Laterality: N/A;  . DILATION AND CURETTAGE OF UTERUS    . TUBAL LIGATION      Family History  Problem Relation Age of Onset  . Hypertension Mother   . Hypothyroidism Mother   . Diabetes Father   . Hypertension Father     Allergies  Allergen Reactions  . Ambien [Zolpidem]     Outpatient Medications Prior to Visit  Medication Sig Dispense Refill  . cetirizine (ZYRTEC) 10 MG tablet Take 1 tablet (10 mg total) by mouth daily. (Patient not taking: No sig reported) 30 tablet 11  . diclofenac sodium (VOLTAREN) 1 % GEL Apply 2 g topically 4 (four) times daily. (Patient not taking: No sig reported) 1 Tube 0  . meloxicam (MOBIC) 7.5 MG tablet Take 1 tablet (7.5 mg total) by mouth daily. (Patient not taking: No sig reported) 15 tablet 0  . nitrofurantoin, macrocrystal-monohydrate, (MACROBID) 100 MG capsule Take 1 capsule (100 mg total) by mouth 2 (two) times daily. (Patient not taking: No sig reported) 10 capsule 0   No facility-administered medications prior to visit.     ROS Review of Systems  Constitutional: Negative for activity change, appetite change and fatigue.  HENT: Negative for congestion, sinus pressure and sore throat.   Eyes: Negative for visual disturbance.  Respiratory: Negative for cough, chest tightness,  shortness of breath and wheezing.   Cardiovascular: Negative for chest pain and palpitations.  Gastrointestinal: Negative for abdominal distention, abdominal pain and constipation.  Endocrine: Negative for polydipsia.  Genitourinary: Negative for dysuria and frequency.  Musculoskeletal: Negative for arthralgias and back pain.  Skin: Negative for rash.  Neurological: Negative for tremors, light-headedness and numbness.  Hematological: Does not bruise/bleed easily.  Psychiatric/Behavioral: Negative for agitation and behavioral problems.    Objective:  BP 115/76   Pulse 98   Ht $R'5\' 9"'EL$  (1.753 m)   Wt 232 lb 9.6 oz (105.5 kg)   SpO2 99%   BMI 34.35 kg/m   BP/Weight 01/09/2021 1/66/0630 11/24/107  Systolic BP 323 557 322  Diastolic BP 76 84 87  Wt. (Lbs) 232.6 233 218  BMI 34.35 34.41 -      Physical Exam Constitutional:      Appearance: She is well-developed.  HENT:     Right Ear: Tympanic membrane normal.     Left Ear: Tympanic membrane normal.     Nose: Nose normal.     Mouth/Throat:     Mouth: Mucous membranes are moist.  Eyes:     Extraocular Movements: Extraocular movements intact.  Neck:     Vascular: No JVD.  Cardiovascular:     Rate and Rhythm: Normal rate.     Heart sounds: Normal heart sounds. No murmur heard.   Pulmonary:     Effort: Pulmonary effort is normal.  Breath sounds: Normal breath sounds. No wheezing or rales.  Chest:     Chest wall: No tenderness.  Abdominal:     General: Bowel sounds are normal. There is no distension.     Palpations: Abdomen is soft. There is no mass.     Tenderness: There is no abdominal tenderness.  Musculoskeletal:        General: Normal range of motion.     Cervical back: Normal range of motion.     Right lower leg: No edema.     Left lower leg: No edema.  Skin:    General: Skin is warm.  Neurological:     Mental Status: She is alert and oriented to person, place, and time.  Psychiatric:        Mood and Affect:  Mood normal.     CMP Latest Ref Rng & Units 09/12/2016 12/29/2014 06/01/2014  Glucose 65 - 99 mg/dL 86 98 110(H)  BUN 7 - 25 mg/dL $Remove'8 6 7  'rbxcJAY$ Creatinine 0.50 - 1.10 mg/dL 0.74 0.69 0.69  Sodium 135 - 146 mmol/L 139 137 140  Potassium 3.5 - 5.3 mmol/L 3.9 3.3(L) 3.4(L)  Chloride 98 - 110 mmol/L 106 106 101  CO2 20 - 31 mmol/L $RemoveB'26 24 25  'lDGbjyQP$ Calcium 8.6 - 10.2 mg/dL 9.4 8.3(L) 9.4  Total Protein 6.1 - 8.1 g/dL 7.3 - 7.5  Total Bilirubin 0.2 - 1.2 mg/dL 0.3 - 0.3  Alkaline Phos 33 - 115 U/L 63 - 57  AST 10 - 30 U/L 19 - 23  ALT 6 - 29 U/L 15 - 17    Lipid Panel     Component Value Date/Time   CHOL 177 06/27/2014 1050   TRIG 142 06/27/2014 1050   HDL 47 06/27/2014 1050   CHOLHDL 3.8 06/27/2014 1050   VLDL 28 06/27/2014 1050   LDLCALC 102 (H) 06/27/2014 1050    CBC    Component Value Date/Time   WBC 8.5 09/12/2016 1628   RBC 4.29 09/12/2016 1628   HGB 11.8 09/12/2016 1628   HCT 36.2 09/12/2016 1628   PLT 277 09/12/2016 1628   MCV 84.4 09/12/2016 1628   MCH 27.5 09/12/2016 1628   MCHC 32.6 09/12/2016 1628   RDW 15.4 (H) 09/12/2016 1628   LYMPHSABS 1.5 12/29/2014 1116   MONOABS 0.6 12/29/2014 1116   EOSABS 0.1 12/29/2014 1116   BASOSABS 0.0 12/29/2014 1116    Lab Results  Component Value Date   HGBA1C 5.6 09/12/2016    Assessment & Plan:  1. Annual physical exam Counseled on 150 minutes of exercise per week, healthy eating (including decreased daily intake of saturated fats, cholesterol, added sugars, sodium), routine healthcare maintenance.  - CBC with Differential/Platelet  2. Screening for metabolic disorder - Lipid panel - CMP14+EGFR - VITAMIN D 25 Hydroxy (Vit-D Deficiency, Fractures)  3. Encounter for screening mammogram for malignant neoplasm of breast - MM 3D SCREEN BREAST BILATERAL; Future  4. Screening for viral disease - HCV RNA quant rflx ultra or genotyp([facility]) - HIV Antibody (routine testing w rflx)    No orders of the defined types  were placed in this encounter.   Follow-up: Return in about 1 month (around 02/06/2021) for Pap smear.       Charlott Rakes, MD, FAAFP. [facility] and [facility], [place]   01/09/2021, 1:11 PM

## 2021-01-09 NOTE — Progress Notes (Signed)
Would like to iron levels and full lab work up.  Pt has no other concerns.

## 2021-01-11 LAB — CMP14+EGFR
ALT: 14 IU/L (ref 0–32)
AST: 19 IU/L (ref 0–40)
Albumin/Globulin Ratio: 1.6 (ref 1.2–2.2)
Albumin: 4.5 g/dL (ref 3.8–4.8)
Alkaline Phosphatase: 71 IU/L (ref 44–121)
BUN/Creatinine Ratio: 5 — ABNORMAL LOW (ref 9–23)
BUN: 4 mg/dL — ABNORMAL LOW (ref 6–24)
Bilirubin Total: 0.3 mg/dL (ref 0.0–1.2)
CO2: 21 mmol/L (ref 20–29)
Calcium: 9.4 mg/dL (ref 8.7–10.2)
Chloride: 105 mmol/L (ref 96–106)
Creatinine, Ser: 0.75 mg/dL (ref 0.57–1.00)
GFR calc Af Amer: 114 mL/min/{1.73_m2} (ref >59)
GFR calc non Af Amer: 99 mL/min/{1.73_m2} (ref >59)
Globulin, Total: 2.9 g/dL (ref 1.5–4.5)
Glucose: 91 mg/dL (ref 65–99)
Potassium: 4.3 mmol/L (ref 3.5–5.2)
Sodium: 141 mmol/L (ref 134–144)
Total Protein: 7.4 g/dL (ref 6.0–8.5)

## 2021-01-11 LAB — HCV RNA QUANT RFLX ULTRA OR GENOTYP: HCV Quant Baseline: NOT DETECTED IU/mL

## 2021-01-11 LAB — CBC WITH DIFFERENTIAL/PLATELET
Basophils Absolute: 0 10*3/uL (ref 0.0–0.2)
Basos: 1 %
EOS (ABSOLUTE): 0.1 10*3/uL (ref 0.0–0.4)
Eos: 1 %
Hematocrit: 36.7 % (ref 34.0–46.6)
Hemoglobin: 12.2 g/dL (ref 11.1–15.9)
Immature Grans (Abs): 0 10*3/uL (ref 0.0–0.1)
Immature Granulocytes: 0 %
Lymphocytes Absolute: 1.5 10*3/uL (ref 0.7–3.1)
Lymphs: 22 %
MCH: 28 pg (ref 26.6–33.0)
MCHC: 33.2 g/dL (ref 31.5–35.7)
MCV: 84 fL (ref 79–97)
Monocytes Absolute: 0.5 10*3/uL (ref 0.1–0.9)
Monocytes: 8 %
Neutrophils Absolute: 4.9 10*3/uL (ref 1.4–7.0)
Neutrophils: 68 %
Platelets: 319 10*3/uL (ref 150–450)
RBC: 4.36 x10E6/uL (ref 3.77–5.28)
RDW: 14.5 % (ref 11.7–15.4)
WBC: 7.1 10*3/uL (ref 3.4–10.8)

## 2021-01-11 LAB — LIPID PANEL
Chol/HDL Ratio: 3.7 ratio (ref 0.0–4.4)
Cholesterol, Total: 198 mg/dL (ref 100–199)
HDL: 54 mg/dL (ref >39)
LDL Chol Calc (NIH): 129 mg/dL — ABNORMAL HIGH (ref 0–99)
Triglycerides: 85 mg/dL (ref 0–149)
VLDL Cholesterol Cal: 15 mg/dL (ref 5–40)

## 2021-01-11 LAB — HIV ANTIBODY (ROUTINE TESTING W REFLEX): HIV Screen 4th Generation wRfx: NONREACTIVE

## 2021-01-11 LAB — VITAMIN D 25 HYDROXY (VIT D DEFICIENCY, FRACTURES): Vit D, 25-Hydroxy: 29 ng/mL — ABNORMAL LOW (ref 30.0–100.0)

## 2021-02-14 ENCOUNTER — Ambulatory Visit: Payer: Managed Care, Other (non HMO) | Admitting: Family Medicine

## 2021-10-09 ENCOUNTER — Ambulatory Visit: Payer: Managed Care, Other (non HMO) | Admitting: Critical Care Medicine

## 2021-10-09 NOTE — Progress Notes (Deleted)
Established Patient Office Visit  Subjective:  Patient ID: Patricia Jenkins, female    DOB: July 07, 1981  Age: 40 y.o. MRN: [id number]  CC: No chief complaint on file.   HPI Patricia Jenkins presents for ***  Past Medical History:  Diagnosis Date   Chronic headaches    Herpes    Pneumonia 2010    Past Surgical History:  Procedure Laterality Date   CHOLECYSTECTOMY  10/03/2011   Procedure: LAPAROSCOPIC CHOLECYSTECTOMY WITH INTRAOPERATIVE CHOLANGIOGRAM;  Surgeon: Harl Bowie, MD;  Location: [facility];  Service: General;  Laterality: N/A;   DILATION AND CURETTAGE OF UTERUS     TUBAL LIGATION      Family History  Problem Relation Age of Onset   Hypertension Mother    Hypothyroidism Mother    Diabetes Father    Hypertension Father     Social History   Socioeconomic History   Marital status: Married    Spouse name: Not on file   Number of children: Not on file   Years of education: Not on file   Highest education level: Not on file  Occupational History   Not on file  Tobacco Use   Smoking status: Never   Smokeless tobacco: Never  Substance and Sexual Activity   Alcohol use: Not Currently    Comment: occasionally   Drug use: No   Sexual activity: Yes    Partners: Male    Birth control/protection: Surgical    Comment: Tubal Ligation   Other Topics Concern   Not on file  Social History Narrative   ** Merged History Encounter **       Social Determinants of Health   Financial Resource Strain: Not on file  Food Insecurity: Not on file  Transportation Needs: Not on file  Physical Activity: Not on file  Stress: Not on file  Social Connections: Not on file  Intimate Partner Violence: Not on file    Outpatient Medications Prior to Visit  Medication Sig Dispense Refill   cetirizine (ZYRTEC) 10 MG tablet Take 1 tablet (10 mg total) by mouth daily. (Patient not taking: No sig reported) 30 tablet 11   diclofenac sodium (VOLTAREN) 1 % GEL Apply 2  g topically 4 (four) times daily. (Patient not taking: No sig reported) 1 Tube 0   meloxicam (MOBIC) 7.5 MG tablet Take 1 tablet (7.5 mg total) by mouth daily. (Patient not taking: No sig reported) 15 tablet 0   nitrofurantoin, macrocrystal-monohydrate, (MACROBID) 100 MG capsule Take 1 capsule (100 mg total) by mouth 2 (two) times daily. (Patient not taking: No sig reported) 10 capsule 0   No facility-administered medications prior to visit.    Allergies  Allergen Reactions   Ambien [Zolpidem]     ROS Review of Systems    Objective:    Physical Exam  There were no vitals taken for this visit. Wt Readings from Last 3 Encounters:  01/09/21 232 lb 9.6 oz (105.5 kg)  08/08/20 233 lb (105.7 kg)  11/22/19 218 lb (98.9 kg)     Health Maintenance Due  Topic Date Due   COVID-19 Vaccine (1) Never done   Hepatitis C Screening  Never done   PAP SMEAR-Modifier  12/03/2020   INFLUENZA VACCINE  06/18/2021    There are no preventive care reminders to display for this patient.  Lab Results  Component Value Date   TSH 1.638 07/26/2015   Lab Results  Component Value Date   WBC 7.1 01/09/2021   HGB  12.2 01/09/2021   HCT 36.7 01/09/2021   MCV 84 01/09/2021   PLT 319 01/09/2021   Lab Results  Component Value Date   NA 141 01/09/2021   K 4.3 01/09/2021   CO2 21 01/09/2021   GLUCOSE 91 01/09/2021   BUN 4 (L) 01/09/2021   CREATININE 0.75 01/09/2021   BILITOT 0.3 01/09/2021   ALKPHOS 71 01/09/2021   AST 19 01/09/2021   ALT 14 01/09/2021   PROT 7.4 01/09/2021   ALBUMIN 4.5 01/09/2021   CALCIUM 9.4 01/09/2021   ANIONGAP 7 12/29/2014   Lab Results  Component Value Date   CHOL 198 01/09/2021   Lab Results  Component Value Date   HDL 54 01/09/2021   Lab Results  Component Value Date   LDLCALC 129 (H) 01/09/2021   Lab Results  Component Value Date   TRIG 85 01/09/2021   Lab Results  Component Value Date   CHOLHDL 3.7 01/09/2021   Lab Results  Component Value  Date   HGBA1C 5.6 09/12/2016      Assessment & Plan:   Problem List Items Addressed This Visit   None   No orders of the defined types were placed in this encounter.   Follow-up: No follow-ups on file.    Asencion Noble, MD

## 2021-10-21 NOTE — Progress Notes (Incomplete)
Established Patient Office Visit  Subjective:  Patient ID: Patricia Jenkins, female    DOB: 1981-01-23  Age: 40 y.o. MRN: [id number]  CC: No chief complaint on file.   HPI Patricia Jenkins presents for  [employer] 12/2020 for CPE  PCP pt of [facility]   needs PAP, FLU hcv 1. Annual physical exam Counseled on 150 minutes of exercise per week, healthy eating (including decreased daily intake of saturated fats, cholesterol, added sugars, sodium), routine healthcare maintenance.   - CBC with Differential/Platelet   2. Screening for metabolic disorder - Lipid panel - CMP14+EGFR - VITAMIN D 25 Hydroxy (Vit-D Deficiency, Fractures)   3. Encounter for screening mammogram for malignant neoplasm of breast - MM 3D SCREEN BREAST BILATERAL; Future   4. Screening for viral disease - HCV RNA quant rflx ultra or genotyp([facility]) - HIV Antibody (routine testing w rflx)  Never returned for f/u pap with [facility] in 01/2021   Past Medical History:  Diagnosis Date   Chronic headaches    Herpes    Pneumonia 2010    Past Surgical History:  Procedure Laterality Date   CHOLECYSTECTOMY  10/03/2011   Procedure: LAPAROSCOPIC CHOLECYSTECTOMY WITH INTRAOPERATIVE CHOLANGIOGRAM;  Surgeon: Harl Bowie, MD;  Location: [facility];  Service: General;  Laterality: N/A;   DILATION AND CURETTAGE OF UTERUS     TUBAL LIGATION      Family History  Problem Relation Age of Onset   Hypertension Mother    Hypothyroidism Mother    Diabetes Father    Hypertension Father     Social History   Socioeconomic History   Marital status: Married    Spouse name: Not on file   Number of children: Not on file   Years of education: Not on file   Highest education level: Not on file  Occupational History   Not on file  Tobacco Use   Smoking status: Never   Smokeless tobacco: Never  Substance and Sexual Activity   Alcohol use: Not Currently    Comment: occasionally   Drug  use: No   Sexual activity: Yes    Partners: Male    Birth control/protection: Surgical    Comment: Tubal Ligation   Other Topics Concern   Not on file  Social History Narrative   ** Merged History Encounter **       Social Determinants of Health   Financial Resource Strain: Not on file  Food Insecurity: Not on file  Transportation Needs: Not on file  Physical Activity: Not on file  Stress: Not on file  Social Connections: Not on file  Intimate Partner Violence: Not on file    Outpatient Medications Prior to Visit  Medication Sig Dispense Refill   cetirizine (ZYRTEC) 10 MG tablet Take 1 tablet (10 mg total) by mouth daily. (Patient not taking: No sig reported) 30 tablet 11   diclofenac sodium (VOLTAREN) 1 % GEL Apply 2 g topically 4 (four) times daily. (Patient not taking: No sig reported) 1 Tube 0   meloxicam (MOBIC) 7.5 MG tablet Take 1 tablet (7.5 mg total) by mouth daily. (Patient not taking: No sig reported) 15 tablet 0   nitrofurantoin, macrocrystal-monohydrate, (MACROBID) 100 MG capsule Take 1 capsule (100 mg total) by mouth 2 (two) times daily. (Patient not taking: No sig reported) 10 capsule 0   No facility-administered medications prior to visit.    Allergies  Allergen Reactions   Ambien [Zolpidem]     ROS Review of Systems  Objective:    Physical Exam  There were no vitals taken for this visit. Wt Readings from Last 3 Encounters:  01/09/21 232 lb 9.6 oz (105.5 kg)  08/08/20 233 lb (105.7 kg)  11/22/19 218 lb (98.9 kg)     Health Maintenance Due  Topic Date Due   COVID-19 Vaccine (1) Never done   Hepatitis C Screening  Never done   PAP SMEAR-Modifier  12/03/2020   INFLUENZA VACCINE  06/18/2021    There are no preventive care reminders to display for this patient.  Lab Results  Component Value Date   TSH 1.638 07/26/2015   Lab Results  Component Value Date   WBC 7.1 01/09/2021   HGB 12.2 01/09/2021   HCT 36.7 01/09/2021    MCV 84 01/09/2021   PLT 319 01/09/2021   Lab Results  Component Value Date   NA 141 01/09/2021   K 4.3 01/09/2021   CO2 21 01/09/2021   GLUCOSE 91 01/09/2021   BUN 4 (L) 01/09/2021   CREATININE 0.75 01/09/2021   BILITOT 0.3 01/09/2021   ALKPHOS 71 01/09/2021   AST 19 01/09/2021   ALT 14 01/09/2021   PROT 7.4 01/09/2021   ALBUMIN 4.5 01/09/2021   CALCIUM 9.4 01/09/2021   ANIONGAP 7 12/29/2014   Lab Results  Component Value Date   CHOL 198 01/09/2021   Lab Results  Component Value Date   HDL 54 01/09/2021   Lab Results  Component Value Date   LDLCALC 129 (H) 01/09/2021   Lab Results  Component Value Date   TRIG 85 01/09/2021   Lab Results  Component Value Date   CHOLHDL 3.7 01/09/2021   Lab Results  Component Value Date   HGBA1C 5.6 09/12/2016      Assessment & Plan:   Problem List Items Addressed This Visit   None   No orders of the defined types were placed in this encounter.   Follow-up: No follow-ups on file.    Asencion Noble, MD

## 2021-10-22 ENCOUNTER — Ambulatory Visit: Payer: Managed Care, Other (non HMO) | Admitting: Critical Care Medicine

## 2021-10-23 ENCOUNTER — Other Ambulatory Visit: Payer: Self-pay

## 2021-10-23 ENCOUNTER — Encounter: Payer: Self-pay | Admitting: Obstetrics & Gynecology

## 2021-10-23 ENCOUNTER — Ambulatory Visit (INDEPENDENT_AMBULATORY_CARE_PROVIDER_SITE_OTHER): Payer: Managed Care, Other (non HMO) | Admitting: Obstetrics & Gynecology

## 2021-10-23 ENCOUNTER — Other Ambulatory Visit (HOSPITAL_COMMUNITY)
Admission: RE | Admit: 2021-10-23 | Discharge: 2021-10-23 | Disposition: A | Discharge status: Home / Self Care | Payer: Managed Care, Other (non HMO) | Source: Ambulatory Visit | Attending: Obstetrics & Gynecology | Admitting: Obstetrics & Gynecology

## 2021-10-23 VITALS — BP 125/84 | HR 108 | Wt 232.0 lb

## 2021-10-23 DIAGNOSIS — Z01419 Encounter for gynecological examination (general) (routine) without abnormal findings: Secondary | ICD-10-CM | POA: Insufficient documentation

## 2021-10-23 DIAGNOSIS — Z1231 Encounter for screening mammogram for malignant neoplasm of breast: Secondary | ICD-10-CM

## 2021-10-23 DIAGNOSIS — R6882 Decreased libido: Secondary | ICD-10-CM | POA: Diagnosis not present

## 2021-10-23 MED ORDER — ADDYI 100 MG PO TABS: 100.0000 mg | ORAL_TABLET | Freq: Every day | ORAL | 10 refills | Status: DC

## 2021-10-23 NOTE — Progress Notes (Signed)
GYNECOLOGY ANNUAL PREVENTATIVE CARE ENCOUNTER NOTE  History:     Patricia Jenkins is a 40 y.o. [phone] female here for a routine annual gynecologic exam.  Current complaints: she feels she is going through perimenopause.  Normal periods, but having mood swings, decreased libido.  Wants intervention for the decreased libido.   Denies abnormal vaginal bleeding, discharge, pelvic pain, problems with intercourse or other gynecologic concerns.    Gynecologic History Patient's last menstrual period was 10/14/2021. Contraception: tubal ligation Last Pap: 12/03/2017. Result was normal with negative HPV Never had a mammogram  Obstetric History OB History  Gravida Para Term Preterm AB Living  4 3 3  0 1 3  SAB IAB Ectopic Multiple Live Births  1 0 0   3    # Outcome Date GA Lbr Len/2nd Weight Sex Delivery Anes PTL Lv  4 Term 10/20/06 [redacted]w[redacted]d  9 lb (4.082 kg) M Vag-Spont EPI  LIV  3 Term 01/31/04 [redacted]w[redacted]d  6 lb (2.722 kg) M Vag-Spont EPI  LIV  2 Term 12/20/01 [redacted]w[redacted]d  7 lb (3.175 kg) M Vag-Spont EPI  LIV  1 SAB 2003        DEC    Past Medical History:  Diagnosis Date   Chronic headaches    Herpes    Pneumonia 2010    Past Surgical History:  Procedure Laterality Date   CHOLECYSTECTOMY  10/03/2011   Procedure: LAPAROSCOPIC CHOLECYSTECTOMY WITH INTRAOPERATIVE CHOLANGIOGRAM;  Surgeon: Harl Bowie, MD;  Location: [facility];  Service: General;  Laterality: N/A;   DILATION AND CURETTAGE OF UTERUS     TUBAL LIGATION      No current outpatient medications on file prior to visit.   No current facility-administered medications on file prior to visit.    Allergies  Allergen Reactions   Ambien [Zolpidem]     Social History:  reports that she has never smoked. She has never used smokeless tobacco. She reports that she does not currently use alcohol. She reports that she does not use drugs.  Family History  Problem Relation Age of Onset   Hypertension Mother    Hypothyroidism  Mother    Diabetes Father    Hypertension Father     The following portions of the patient's history were reviewed and updated as appropriate: allergies, current medications, past family history, past medical history, past social history, past surgical history and problem list.  Review of Systems Pertinent items noted in HPI and remainder of comprehensive ROS otherwise negative.  Physical Exam:  BP 125/84   Pulse (!) 108   Wt 232 lb (105.2 kg)   LMP 10/14/2021   BMI 34.26 kg/m  CONSTITUTIONAL: Well-developed, well-nourished female in no acute distress.  HENT:  Normocephalic, atraumatic, External right and left ear normal.  EYES: Conjunctivae and EOM are normal. Pupils are equal, round, and reactive to light. No scleral icterus.  NECK: Normal range of motion, supple, no masses.  Normal thyroid.  SKIN: Skin is warm and dry. No rash noted. Not diaphoretic. No erythema. No pallor. MUSCULOSKELETAL: Normal range of motion. No tenderness.  No cyanosis, clubbing, or edema. NEUROLOGIC: Alert and oriented to person, place, and time. Normal reflexes, muscle tone coordination.  PSYCHIATRIC: Normal mood and affect. Normal behavior. Normal judgment and thought content. CARDIOVASCULAR: Normal heart rate noted, regular rhythm RESPIRATORY: Clear to auscultation bilaterally. Effort and breath sounds normal, no problems with respiration noted. BREASTS: Symmetric in size. No masses, tenderness, skin changes, nipple drainage, or lymphadenopathy bilaterally. Performed  in the presence of a chaperone. ABDOMEN: Soft, no distention noted.  No tenderness, rebound or guarding.  PELVIC: Normal appearing external genitalia and urethral meatus; normal appearing vaginal mucosa and cervix.  No abnormal vaginal discharge noted.  Pap smear obtained.  Normal uterine size, no other palpable masses, no uterine or adnexal tenderness.  Performed in the presence of a chaperone.   Assessment and Plan:    1. Decreased libido  secondary to perimenopause Discussed complexity of libido in females, discussed Addyi as an option. This was prescribed to her [facility] and also to [facility]). - Flibanserin (ADDYI) 100 MG TABS; Take 100 mg by mouth at bedtime.  Dispense: 30 tablet; Refill: 10  2. Breast cancer screening by mammogram Mammogram scheduled. - MM 3D SCREEN BREAST BILATERAL; Future  3. Well woman exam with routine gynecological exam - Cytology - PAP Will follow up results of pap smear and manage accordingly. Routine preventative health maintenance measures emphasized. Please refer to After Visit Summary for other counseling recommendations.      Verita Schneiders, MD, [place] [facility] [employer], [place]

## 2021-10-25 LAB — CYTOLOGY - PAP
Comment: NEGATIVE
Diagnosis: NEGATIVE
High risk HPV: NEGATIVE

## 2021-10-26 ENCOUNTER — Other Ambulatory Visit: Payer: Self-pay | Admitting: Obstetrics & Gynecology

## 2021-10-26 ENCOUNTER — Other Ambulatory Visit: Payer: Self-pay

## 2021-10-26 ENCOUNTER — Ambulatory Visit
Admission: RE | Admit: 2021-10-26 | Discharge: 2021-10-26 | Disposition: A | Discharge status: Home / Self Care | Payer: Managed Care, Other (non HMO) | Source: Ambulatory Visit | Attending: Obstetrics & Gynecology | Admitting: Obstetrics & Gynecology

## 2021-10-26 DIAGNOSIS — R921 Mammographic calcification found on diagnostic imaging of breast: Secondary | ICD-10-CM

## 2021-10-26 DIAGNOSIS — R928 Other abnormal and inconclusive findings on diagnostic imaging of breast: Secondary | ICD-10-CM

## 2021-10-26 DIAGNOSIS — Z1231 Encounter for screening mammogram for malignant neoplasm of breast: Secondary | ICD-10-CM | POA: Diagnosis present

## 2021-11-01 ENCOUNTER — Ambulatory Visit: Payer: Managed Care, Other (non HMO) | Admitting: Critical Care Medicine

## 2021-11-08 ENCOUNTER — Ambulatory Visit
Admission: RE | Admit: 2021-11-08 | Discharge: 2021-11-08 | Disposition: A | Discharge status: Home / Self Care | Payer: Managed Care, Other (non HMO) | Source: Ambulatory Visit | Attending: Obstetrics & Gynecology | Admitting: Obstetrics & Gynecology

## 2021-11-08 ENCOUNTER — Other Ambulatory Visit: Payer: Self-pay

## 2021-11-08 DIAGNOSIS — R928 Other abnormal and inconclusive findings on diagnostic imaging of breast: Secondary | ICD-10-CM | POA: Diagnosis present

## 2021-11-08 DIAGNOSIS — R921 Mammographic calcification found on diagnostic imaging of breast: Secondary | ICD-10-CM | POA: Diagnosis present

## 2021-11-27 ENCOUNTER — Ambulatory Visit: Payer: Managed Care, Other (non HMO) | Admitting: Critical Care Medicine

## 2021-11-30 ENCOUNTER — Ambulatory Visit (INDEPENDENT_AMBULATORY_CARE_PROVIDER_SITE_OTHER): Payer: Medicaid Other

## 2021-11-30 ENCOUNTER — Ambulatory Visit: Payer: Medicaid Other | Admitting: Podiatry

## 2021-11-30 ENCOUNTER — Other Ambulatory Visit: Payer: Self-pay

## 2021-11-30 ENCOUNTER — Encounter: Payer: Self-pay | Admitting: Podiatry

## 2021-11-30 DIAGNOSIS — M779 Enthesopathy, unspecified: Secondary | ICD-10-CM

## 2021-11-30 DIAGNOSIS — M7752 Other enthesopathy of left foot: Secondary | ICD-10-CM | POA: Diagnosis not present

## 2021-11-30 DIAGNOSIS — M778 Other enthesopathies, not elsewhere classified: Secondary | ICD-10-CM

## 2021-11-30 MED ORDER — MELOXICAM 15 MG PO TABS: 15.0000 mg | ORAL_TABLET | Freq: Every day | ORAL | 1 refills | Status: DC

## 2021-11-30 NOTE — Progress Notes (Signed)
° °  HPI: 41 y.o. female presenting today as a new patient for evaluation of pain and tenderness to the lateral aspect of the left foot.  Patient states that she has had this intermittently off and on for months.  She develops pain to the lateral aspect of the foot throughout the day.  Aggravated by exercising and walking.  She has been icing her foot and up taking Tylenol as needed.  She denies a history of injury.  She presents for further treatment and evaluation  Past Medical History:  Diagnosis Date   Chronic headaches    Herpes    Pneumonia 2010    Past Surgical History:  Procedure Laterality Date   CHOLECYSTECTOMY  10/03/2011   Procedure: LAPAROSCOPIC CHOLECYSTECTOMY WITH INTRAOPERATIVE CHOLANGIOGRAM;  Surgeon: Harl Bowie, MD;  Location: [facility];  Service: General;  Laterality: N/A;   DILATION AND CURETTAGE OF UTERUS     TUBAL LIGATION      Allergies  Allergen Reactions   Ambien [Zolpidem]      Physical Exam: General: The patient is alert and oriented x3 in no acute distress.  Dermatology: Skin is warm, dry and supple bilateral lower extremities. Negative for open lesions or macerations.  Vascular: Palpable pedal pulses bilaterally. Capillary refill within normal limits.  Negative for any significant edema or erythema  Neurological: Light touch and protective threshold grossly intact  Musculoskeletal Exam: No pedal deformities noted.  Pain to palpation noted to the lateral column of the left foot at the fourth and fifth TMT joint.  Radiographic Exam:  Normal osseous mineralization. Joint spaces preserved. No fracture/dislocation/boney destruction.    Assessment: 1.  Capsulitis lateral TMT left foot   Plan of Care:  1. Patient evaluated. X-Rays reviewed.  2.  Discussed conservative treatment options including arch supports and advised against going barefoot. 3.  OTC power step insoles were provided for the patient.  Wear daily 4.  Prescription for meloxicam 15  mg daily 5.  Offered a steroid injection to this area but the patient declined. 6.  Return to clinic as needed      Edrick Kins, DPM [facility]  Dr. Edrick Kins, DPM    [address], [place] [address]                Office [phone]  Fax [phone]

## 2021-12-10 ENCOUNTER — Ambulatory Visit: Payer: Self-pay

## 2021-12-10 NOTE — Telephone Encounter (Signed)
Pt called reporting symptoms: cough, congestion, headache occasionally, did have chills, fatigue. Symptoms began a week ago.  Best contact: [phone]    Chief Complaint: Cough, sinus congestion Symptoms: Headache,chills, fatigue Frequency: Started 1 week ago Pertinent Negatives: Patient denies shortness of breath Disposition: [] ED /[] Urgent Care (no appt availability in office) / [] Appointment(In office/virtual)/ []  [facility]/ [] Home Care/ [] Refused Recommended Disposition /[] [facility]/ []  Follow-up with PCP Additional Notes: Requests a virtual visit. Please advise pt.   Answer Assessment - Initial Assessment Questions 1. ONSET: "When did the cough begin?"      1 week ago 2. SEVERITY: "How bad is the cough today?"      Severe 3. SPUTUM: "Describe the color of your sputum" (none, dry cough; clear, white, yellow, green)     Green 4. HEMOPTYSIS: "Are you coughing up any blood?" If so ask: "How much?" (flecks, streaks, tablespoons, etc.)     No 5. DIFFICULTY BREATHING: "Are you having difficulty breathing?" If Yes, ask: "How bad is it?" (e.g., mild, moderate, severe)    - MILD: No SOB at rest, mild SOB with walking, speaks normally in sentences, can lie down, no retractions, pulse < 100.    - MODERATE: SOB at rest, SOB with minimal exertion and prefers to sit, cannot lie down flat, speaks in phrases, mild retractions, audible wheezing, pulse 100-120.    - SEVERE: Very SOB at rest, speaks in single words, struggling to breathe, sitting hunched forward, retractions, pulse > 120      No 6. FEVER: "Do you have a fever?" If Yes, ask: "What is your temperature, how was it measured, and when did it start?"     No 7. CARDIAC HISTORY: "Do you have any history of heart disease?" (e.g., heart attack, congestive heart failure)      No 8. LUNG HISTORY: "Do you have any history of lung disease?"  (e.g., pulmonary embolus, asthma, emphysema)     No 9. PE RISK FACTORS: "Do  you have a history of blood clots?" (or: recent major surgery, recent prolonged travel, bedridden)     No 10. OTHER SYMPTOMS: "Do you have any other symptoms?" (e.g., runny nose, wheezing, chest pain)       Sinus drainage 11. PREGNANCY: "Is there any chance you are pregnant?" "When was your last menstrual period?"       No 12. TRAVEL: "Have you traveled out of the country in the last month?" (e.g., travel history, exposures)       No  Protocols used: Cough - Acute Productive-A-AH

## 2021-12-11 ENCOUNTER — Telehealth (HOSPITAL_BASED_OUTPATIENT_CLINIC_OR_DEPARTMENT_OTHER): Payer: 59 | Admitting: Nurse Practitioner

## 2021-12-11 ENCOUNTER — Encounter: Payer: Self-pay | Admitting: Nurse Practitioner

## 2021-12-11 DIAGNOSIS — B9689 Other specified bacterial agents as the cause of diseases classified elsewhere: Secondary | ICD-10-CM | POA: Diagnosis not present

## 2021-12-11 DIAGNOSIS — J019 Acute sinusitis, unspecified: Secondary | ICD-10-CM | POA: Diagnosis not present

## 2021-12-11 MED ORDER — PSEUDOEPH-BROMPHEN-DM 30-2-10 MG/5ML PO SYRP: 5.0000 mL | ORAL_SOLUTION | Freq: Four times a day (QID) | ORAL | 0 refills | Status: AC | PRN

## 2021-12-11 MED ORDER — AMOXICILLIN-POT CLAVULANATE 875-125 MG PO TABS: 1.0000 | ORAL_TABLET | Freq: Two times a day (BID) | ORAL | 0 refills | Status: AC

## 2021-12-11 NOTE — Progress Notes (Signed)
Virtual Visit Note Due to national recommendations of social distancing due to [place] 19, virtual visit is felt to be most appropriate for this patient at this time.  I discussed the limitations, risks, security and privacy concerns of performing an evaluation and management service by video and the availability of in person appointments. I also discussed with the patient that there may be a patient responsible charge related to this service. The patient expressed understanding and agreed to proceed.    I connected with Patricia Jenkins on 12/11/21  at  10:50 AM EST  EDT by VIDEO and verified that I am speaking with the correct person using two identifiers.   Location of Patient: Private Residence   Location of Provider: [place] and [place] participating in VIRTUAL visit: Geryl Rankins FNP-BC [place]    History of Present Illness: VIRTUAL visit for: sinusitis  Upper respiratory symptoms She complains of congestion, cough described as productive of green sputum, facial pain, headache described as pressure, nasal congestion, post nasal drip, productive cough with  green colored sputum, purulent nasal discharge, sinus pressure, and sneezing.with no fever, chills, night sweats or weight loss. Onset of symptoms was  13 days ago and rapidly worsening.She is drinking plenty of fluids.  Patient is a non-smoker Negative COVID test    Past Medical History:  Diagnosis Date   Chronic headaches    Herpes    Pneumonia 2010    Past Surgical History:  Procedure Laterality Date   CHOLECYSTECTOMY  10/03/2011   Procedure: LAPAROSCOPIC CHOLECYSTECTOMY WITH INTRAOPERATIVE CHOLANGIOGRAM;  Surgeon: Harl Bowie, MD;  Location[facility];  Service: General;  Laterality: N/A;   DILATION AND CURETTAGE OF UTERUS     TUBAL LIGATION      Family History  Problem Relation Age of Onset   Hypertension Mother    Hypothyroidism Mother    Diabetes  Father    Hypertension Father     Social History   Socioeconomic History   Marital status: Married    Spouse name: Not on file   Number of children: Not on file   Years of education: Not on file   Highest education level: Not on file  Occupational History   Not on file  Tobacco Use   Smoking status: Never   Smokeless tobacco: Never  Substance and Sexual Activity   Alcohol use: Not Currently    Comment: occasionally   Drug use: No   Sexual activity: Yes    Partners: Male    Birth control/protection: Surgical    Comment: Tubal Ligation   Other Topics Concern   Not on file  Social History Narrative   ** Merged History Encounter **       Social Determinants of Health   Financial Resource Strain: Not on file  Food Insecurity: Not on file  Transportation Needs: Not on file  Physical Activity: Not on file  Stress: Not on file  Social Connections: Not on file     Observations/Objective: Awake, alert and oriented x 3   Review of Systems  Constitutional:  Positive for chills. Negative for fever, malaise/fatigue and weight loss.  HENT:  Positive for congestion and sinus pain. Negative for nosebleeds.   Eyes: Negative.  Negative for blurred vision, double vision and photophobia.  Respiratory:  Positive for cough, sputum production and shortness of breath.   Cardiovascular: Negative.  Negative for chest pain, palpitations and leg swelling.  Gastrointestinal: Negative.  Negative for heartburn,  nausea and vomiting.  Musculoskeletal: Negative.  Negative for myalgias.  Neurological:  Positive for headaches. Negative for dizziness, focal weakness and seizures.  Psychiatric/Behavioral: Negative.  Negative for suicidal ideas.    Assessment and Plan: Diagnoses and all orders for this visit:  Acute bacterial sinusitis -     brompheniramine-pseudoephedrine-DM 30-2-10 MG/5ML syrup; Take 5 mLs by mouth 4 (four) times daily as needed. -     amoxicillin-clavulanate (AUGMENTIN) 875-125  MG tablet; Take 1 tablet by mouth 2 (two) times daily for 5 days.     Follow Up Instructions Return if symptoms worsen or fail to improve.     I discussed the assessment and treatment plan with the patient. The patient was provided an opportunity to ask questions and all were answered. The patient agreed with the plan and demonstrated an understanding of the instructions.   The patient was advised to call back or seek an in-person evaluation if the symptoms worsen or if the condition fails to improve as anticipated.  I provided 13 minutes of face-to-face time during this encounter including median intraservice time, reviewing previous notes, labs, imaging, medications and explaining diagnosis and management.  Gildardo Pounds, FNP-BC

## 2022-01-10 ENCOUNTER — Telehealth: Payer: Self-pay | Admitting: *Deleted

## 2022-01-10 NOTE — Telephone Encounter (Signed)
PA for Addyi requested through cover my meds, PA-denied

## 2022-06-03 ENCOUNTER — Other Ambulatory Visit: Payer: Self-pay

## 2022-06-03 DIAGNOSIS — R921 Mammographic calcification found on diagnostic imaging of breast: Secondary | ICD-10-CM

## 2022-06-03 DIAGNOSIS — Z1231 Encounter for screening mammogram for malignant neoplasm of breast: Secondary | ICD-10-CM

## 2022-06-03 NOTE — Progress Notes (Signed)
Order changed to [facility] per pt request

## 2022-06-03 NOTE — Progress Notes (Signed)
Pt needs  order for Diag Mammogram of left breast due to left breast calcifications.  Order placed today.

## 2022-06-17 ENCOUNTER — Ambulatory Visit
Admission: RE | Admit: 2022-06-17 | Discharge: 2022-06-17 | Disposition: A | Discharge status: Home / Self Care | Payer: 59 | Source: Ambulatory Visit | Attending: Obstetrics & Gynecology | Admitting: Obstetrics & Gynecology

## 2022-06-17 DIAGNOSIS — Z1231 Encounter for screening mammogram for malignant neoplasm of breast: Secondary | ICD-10-CM | POA: Diagnosis present

## 2024-08-23 ENCOUNTER — Encounter: Payer: Self-pay | Admitting: Obstetrics & Gynecology

## 2024-08-23 DIAGNOSIS — Z1239 Encounter for other screening for malignant neoplasm of breast: Secondary | ICD-10-CM

## 2024-09-16 ENCOUNTER — Telehealth: Payer: Self-pay

## 2024-09-16 ENCOUNTER — Other Ambulatory Visit: Payer: Self-pay | Admitting: *Deleted

## 2024-09-16 DIAGNOSIS — N6310 Unspecified lump in the right breast, unspecified quadrant: Secondary | ICD-10-CM

## 2024-09-16 NOTE — Telephone Encounter (Signed)
 Patient was contacted. She was under the impression that she had contacted her OBGYN office. She will call them to request.  Copied from CRM [id number]. Topic: Referral - Question >> Sep 16, 2024  1:21 PM Cleave MATSU wrote: Reason for CRM: pt needs a diagnostic referral sent to mammogram place on [address]. Please call pt to assist

## 2024-09-24 ENCOUNTER — Ambulatory Visit
Admission: RE | Admit: 2024-09-24 | Discharge: 2024-09-24 | Disposition: A | Discharge status: Home / Self Care | Source: Ambulatory Visit | Attending: Obstetrics & Gynecology | Admitting: Obstetrics & Gynecology

## 2024-09-24 ENCOUNTER — Other Ambulatory Visit: Payer: Self-pay | Admitting: Obstetrics & Gynecology

## 2024-09-24 DIAGNOSIS — N6310 Unspecified lump in the right breast, unspecified quadrant: Secondary | ICD-10-CM

## 2024-09-27 ENCOUNTER — Ambulatory Visit
Admission: RE | Admit: 2024-09-27 | Discharge: 2024-09-27 | Disposition: A | Discharge status: Home / Self Care | Source: Ambulatory Visit | Attending: Obstetrics & Gynecology | Admitting: Obstetrics & Gynecology

## 2024-09-27 DIAGNOSIS — N6310 Unspecified lump in the right breast, unspecified quadrant: Secondary | ICD-10-CM

## 2024-09-27 DIAGNOSIS — N6311 Unspecified lump in the right breast, upper outer quadrant: Secondary | ICD-10-CM | POA: Diagnosis not present

## 2024-09-27 DIAGNOSIS — C50411 Malignant neoplasm of upper-outer quadrant of right female breast: Secondary | ICD-10-CM | POA: Diagnosis not present

## 2024-09-27 HISTORY — PX: BREAST BIOPSY: SHX20

## 2024-09-28 ENCOUNTER — Encounter: Payer: Self-pay | Admitting: Obstetrics & Gynecology

## 2024-09-28 LAB — SURGICAL PATHOLOGY

## 2024-09-29 ENCOUNTER — Telehealth: Payer: Self-pay | Admitting: *Deleted

## 2024-09-29 NOTE — Telephone Encounter (Signed)
 Spoke to patient to confirm upcoming morning [facility] clinic appointment on 11/19, paperwork will be sent via email ([email or web address])  Gave location and time, also informed patient that the surgeon's office would be calling as well to get information from them similar to the packet that they will be receiving so make sure to do both.  Reminded patient that all providers will be coming to the clinic to see them HERE and if they had any questions to not hesitate to reach back out to myself or their navigators.

## 2024-10-01 ENCOUNTER — Encounter: Payer: Self-pay | Admitting: *Deleted

## 2024-10-01 DIAGNOSIS — Z170 Estrogen receptor positive status [ER+]: Secondary | ICD-10-CM | POA: Insufficient documentation

## 2024-10-04 NOTE — Progress Notes (Signed)
 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 female seen in consultation for radiation therapy.  Ms. Tennison presented with a palpable R breast mass for which she underwent diagnostic mammogram and US  on 09/24/24. This revealed an indeterminate mass measuring 1.1 cm. 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  She presents to the multidisciplinary breast clinic today to discuss radiotherapy.  Estrogen exposure history:  Childbearing/breastfeeding:   Family history of cancer:  PREVIOUS RADIATION THERAPY: {EXAM; YES/NO:19492::No}  AUTOIMMUNE DISEASE: {EXAM; YES/NO:19492::No}  MEDICAL DEVICES: {EXAM; YES/NO:19492::No}  PREGNANCY: {EXAM; YES/NO:19492::No}   PAST MEDICAL HISTORY:  Past Medical History:  Diagnosis Date   Chronic headaches    Herpes    Pneumonia 2010       PAST SURGICAL HISTORY: Past Surgical History:  Procedure Laterality Date   BREAST BIOPSY Right 09/27/2024   US  RT BREAST BX W LOC DEV 1ST LESION IMG BX SPEC US  GUIDE 09/27/2024 [facility] MAMMOGRAP[facility]   CHOLECYSTECTOMY  10/03/2011   Procedure: LAPAROSCOPIC CHOLECYSTECTOMY WITH INTRAOPERATIVE CHOLANGIOGRAM;  Surgeon: Vicenta DELENA Poli, MD;  Location: [facility] OR;  Service: General;  Laterality: N/A;   DILATION AND CURETTAGE OF UTERUS     TUBAL LIGATION       FAMILY HISTORY:  Family History  Problem Relation Age of Onset   Hypertension Mother    Hypothyroidism Mother    Diabetes Father     Hypertension Father      SOCIAL HISTORY:  reports that she has never smoked. She has never used smokeless tobacco. She reports that she does not currently use alcohol. She reports that she does not use drugs.   ALLERGIES: Ambien [zolpidem]   MEDICATIONS:  Current Outpatient Medications  Medication Sig Dispense Refill   brompheniramine-pseudoephedrine-DM 30-2-10 MG/5ML syrup Take 5 mLs by mouth 4 (four) times daily as needed. 240 mL 0   Flibanserin  (ADDYI ) 100 MG TABS Take 100 mg by mouth at bedtime. 30 tablet 10   meloxicam  (MOBIC ) 15 MG tablet Take 1 tablet (15 mg total) by mouth daily. 30 tablet 1   No current facility-administered medications for this visit.     REVIEW OF SYSTEMS: The patient reports that s***/he is doing well overall and a review of symptoms is otherwise negative.      PHYSICAL EXAM:  Wt Readings from Last 3 Encounters:  10/23/21 232 lb (105.2 kg)  01/09/21 232 lb 9.6 oz (105.5 kg)  08/08/20 233 lb (105.7 kg)   Temp Readings from Last 3 Encounters:  08/08/20 (!) 97.3 F (36.3 C) (Temporal)  11/22/19 98.3 F (36.8 C) (Oral)  09/07/18 98.2 F (36.8 C) (Oral)   BP Readings from Last 3 Encounters:  10/23/21 125/84  01/09/21 115/76  08/08/20 120/84   Pulse Readings from Last 3 Encounters:  10/23/21 (!) 108  01/09/21 98  08/08/20 83    /10   Physical Exam   Breast Exam:  Well-healed lumpectomy/re-excision scar in the *** quadrant. No erythema,  induration, or drainage. No palpable masses or nodularity at the surgical site. No skin changes, nipple inversion, or discharge.   ECOG = ***   LABORATORY DATA:  Lab Results  Component Value Date   WBC 7.1 01/09/2021   HGB 12.2 01/09/2021   HCT 36.7 01/09/2021   MCV 84 01/09/2021   PLT 319 01/09/2021   Lab Results  Component Value Date   NA 141 01/09/2021   K 4.3 01/09/2021   CL 105 01/09/2021   CO2 21 01/09/2021   Lab Results  Component Value Date   ALT 14 01/09/2021   AST 19  01/09/2021   ALKPHOS 71 01/09/2021   BILITOT 0.3 01/09/2021      RADIOGRAPHY: US  RT BREAST BX W LOC DEV 1ST LESION IMG BX SPEC US  GUIDE Addendum Date: 09/28/2024 ADDENDUM REPORT: 09/28/2024 14:11 ADDENDUM: PATHOLOGY revealed: Breast, right, needle core biopsy, 10:00 (coil clip) - INVASIVE DUCTAL CARCINOMA WITH MUCINOUS FEATURES - OVERALL GRADE: II/III - LYMPHOVASCULAR INVASION: NOT IDENTIFIED - CANCER LENGTH: 8 MM IN GREATEST LINEAR DIMENSION ON FRAGMENTED CORES. - CALCIFICATIONS: NOT IDENTIFIED Pathology results are CONCORDANT with imaging findings, per Dr. Inocente Ast. Pathology results and recommendations were discussed with patient via telephone on 09/28/2024 by Rock Hover RN. Patient reported biopsy site doing well with no adverse symptoms, and slight tenderness at the site. Post biopsy care instructions were reviewed, questions were answered and my direct phone number was provided. Patient was instructed to call [facility] for any additional questions or concerns related to biopsy site. RECOMMENDATION: 1. Surgical and oncological consultation. Patient was referred to the [facility] at [facility] with appointment on 10/06/2024. 2. Consider pretreatment bilateral breast MRI with and without contrast given patient's relatively young age and to evaluate extent of breast disease. Pathology results reported by Rock Hover RN on 09/28/2024. Electronically Signed   By: Inocente Ast M.D.   On: 09/28/2024 14:11   Result Date: 09/28/2024 CLINICAL DATA:  43 year old female presenting for biopsy of a mass in the right breast. EXAM: ULTRASOUND GUIDED RIGHT BREAST CORE NEEDLE BIOPSY COMPARISON:  Previous exam(s). PROCEDURE: I met with the patient and we discussed the procedure of ultrasound-guided biopsy, including benefits and alternatives. We discussed the high likelihood of a successful procedure. We discussed the risks  of the procedure, including infection, bleeding, tissue injury, clip migration, and inadequate sampling. Informed written consent was given. The usual time-out protocol was performed immediately prior to the procedure. Lesion quadrant: Upper outer quadrant Using sterile technique and 1% Lidocaine as local anesthetic, under direct ultrasound visualization, a 14 gauge spring-loaded device was used to perform biopsy of a mass in the right breast at 10 o'clock using a lateral approach. At the conclusion of the procedure a coil shaped tissue marker clip was deployed into the biopsy cavity. Follow up 2 view mammogram was performed and dictated separately. IMPRESSION: Ultrasound guided biopsy of a mass in the right breast at 10 o'clock. No apparent complications. Electronically Signed: By: Inocente Ast M.D. On: 09/27/2024 15:26   MM CLIP PLACEMENT RIGHT Result Date: 09/27/2024 CLINICAL DATA:  Post procedure mammogram for clip placement EXAM: 3D DIAGNOSTIC RIGHT MAMMOGRAM POST ULTRASOUND BIOPSY COMPARISON:  Previous exam(s). ACR Breast Density Category c: The breasts are heterogeneously dense, which may obscure small masses. FINDINGS: 3D Mammographic images were obtained following ultrasound guided biopsy of a mass in the right breast at 10 o'clock. The coil biopsy marking clip is in expected  position at the site of biopsy. IMPRESSION: Appropriate positioning of the coil shaped biopsy marking clip at the site of biopsy in the right breast in the 10 o'clock position. Final Assessment: Post Procedure Mammograms for Marker Placement Electronically Signed   By: Inocente Ast M.D.   On: 09/27/2024 15:25   MM 3D DIAGNOSTIC MAMMOGRAM BILATERAL BREAST Result Date: 09/24/2024 CLINICAL DATA:  43 year old female here for evaluation of a right breast lump. EXAM: DIGITAL DIAGNOSTIC BILATERAL MAMMOGRAM WITH TOMOSYNTHESIS AND CAD; ULTRASOUND LEFT BREAST LIMITED; ULTRASOUND RIGHT BREAST LIMITED TECHNIQUE: Bilateral digital  diagnostic mammography and breast tomosynthesis was performed. The images were evaluated with computer-aided detection. ; Targeted ultrasound examination of the left breast was performed.; Targeted ultrasound examination of the right breast was performed Additional 2 and 3D spot compression views were obtained. COMPARISON:  Previous exam(s). ACR Breast Density Category c: The breasts are heterogeneously dense, which may obscure small masses. FINDINGS: Right mammogram: Spot compression views demonstrate a superficial oval equal density mass measuring up to 1 cm which underlies the palpable marker about the upper outer, anterior breast. No suspicious calcifications or architectural distortion. Left mammogram: Two adjacent equal density circumscribed masses about the slightly outer breast, middle third seen only on the CC view. These masses have the mammographic characteristics of a cyst. No findings suspicious for malignancy. Right breast ultrasound: Targeted imaging of the patient's palpable area of concern at about the 10 o'clock position, approximately 2 centimeters from the nipple, demonstrates a superficial, oval hypoechoic mass with partially indistinct margins measuring approximately 1.1 x 1.1 x 0.6 cm. This mass has increased vascularity and requires biopsy for further characterization. Additional imaging of the right axilla was unremarkable. Left breast ultrasound: Targeted imaging at about the 12 o'clock position, approximately 8 centimeters from the nipple, demonstrates two adjacent simple cysts with the largest one measuring approximately 1.4 x 0.8 x 1.4 cm. This correlates favorably with the mammographic findings described above. Additional imaging of the left axilla was unremarkable. IMPRESSION: Indeterminate right breast mass measuring up to 1.1 cm. RECOMMENDATION: Right breast ultrasound-guided biopsy. I have discussed the findings and recommendations with the patient. If applicable, a reminder letter  will be sent to the patient regarding the next appointment. BI-RADS CATEGORY  4: Suspicious. Electronically Signed   By: Curtistine Noble   On: 09/24/2024 09:05   US  LIMITED ULTRASOUND INCLUDING AXILLA LEFT BREAST  Result Date: 09/24/2024 CLINICAL DATA:  43 year old female here for evaluation of a right breast lump. EXAM: DIGITAL DIAGNOSTIC BILATERAL MAMMOGRAM WITH TOMOSYNTHESIS AND CAD; ULTRASOUND LEFT BREAST LIMITED; ULTRASOUND RIGHT BREAST LIMITED TECHNIQUE: Bilateral digital diagnostic mammography and breast tomosynthesis was performed. The images were evaluated with computer-aided detection. ; Targeted ultrasound examination of the left breast was performed.; Targeted ultrasound examination of the right breast was performed Additional 2 and 3D spot compression views were obtained. COMPARISON:  Previous exam(s). ACR Breast Density Category c: The breasts are heterogeneously dense, which may obscure small masses. FINDINGS: Right mammogram: Spot compression views demonstrate a superficial oval equal density mass measuring up to 1 cm which underlies the palpable marker about the upper outer, anterior breast. No suspicious calcifications or architectural distortion. Left mammogram: Two adjacent equal density circumscribed masses about the slightly outer breast, middle third seen only on the CC view. These masses have the mammographic characteristics of a cyst. No findings suspicious for malignancy. Right breast ultrasound: Targeted imaging of the patient's palpable area of concern at about the 10 o'clock position, approximately 2 centimeters from the nipple,  demonstrates a superficial, oval hypoechoic mass with partially indistinct margins measuring approximately 1.1 x 1.1 x 0.6 cm. This mass has increased vascularity and requires biopsy for further characterization. Additional imaging of the right axilla was unremarkable. Left breast ultrasound: Targeted imaging at about the 12 o'clock position, approximately 8  centimeters from the nipple, demonstrates two adjacent simple cysts with the largest one measuring approximately 1.4 x 0.8 x 1.4 cm. This correlates favorably with the mammographic findings described above. Additional imaging of the left axilla was unremarkable. IMPRESSION: Indeterminate right breast mass measuring up to 1.1 cm. RECOMMENDATION: Right breast ultrasound-guided biopsy. I have discussed the findings and recommendations with the patient. If applicable, a reminder letter will be sent to the patient regarding the next appointment. BI-RADS CATEGORY  4: Suspicious. Electronically Signed   By: Curtistine Noble   On: 09/24/2024 09:05   US  LIMITED ULTRASOUND INCLUDING AXILLA RIGHT BREAST Result Date: 09/24/2024 CLINICAL DATA:  43 year old female here for evaluation of a right breast lump. EXAM: DIGITAL DIAGNOSTIC BILATERAL MAMMOGRAM WITH TOMOSYNTHESIS AND CAD; ULTRASOUND LEFT BREAST LIMITED; ULTRASOUND RIGHT BREAST LIMITED TECHNIQUE: Bilateral digital diagnostic mammography and breast tomosynthesis was performed. The images were evaluated with computer-aided detection. ; Targeted ultrasound examination of the left breast was performed.; Targeted ultrasound examination of the right breast was performed Additional 2 and 3D spot compression views were obtained. COMPARISON:  Previous exam(s). ACR Breast Density Category c: The breasts are heterogeneously dense, which may obscure small masses. FINDINGS: Right mammogram: Spot compression views demonstrate a superficial oval equal density mass measuring up to 1 cm which underlies the palpable marker about the upper outer, anterior breast. No suspicious calcifications or architectural distortion. Left mammogram: Two adjacent equal density circumscribed masses about the slightly outer breast, middle third seen only on the CC view. These masses have the mammographic characteristics of a cyst. No findings suspicious for malignancy. Right breast ultrasound: Targeted  imaging of the patient's palpable area of concern at about the 10 o'clock position, approximately 2 centimeters from the nipple, demonstrates a superficial, oval hypoechoic mass with partially indistinct margins measuring approximately 1.1 x 1.1 x 0.6 cm. This mass has increased vascularity and requires biopsy for further characterization. Additional imaging of the right axilla was unremarkable. Left breast ultrasound: Targeted imaging at about the 12 o'clock position, approximately 8 centimeters from the nipple, demonstrates two adjacent simple cysts with the largest one measuring approximately 1.4 x 0.8 x 1.4 cm. This correlates favorably with the mammographic findings described above. Additional imaging of the left axilla was unremarkable. IMPRESSION: Indeterminate right breast mass measuring up to 1.1 cm. RECOMMENDATION: Right breast ultrasound-guided biopsy. I have discussed the findings and recommendations with the patient. If applicable, a reminder letter will be sent to the patient regarding the next appointment. BI-RADS CATEGORY  4: Suspicious. Electronically Signed   By: Curtistine Noble   On: 09/24/2024 09:05     PATHOLOGY:   Accession #: [id number]  Patient Name: Patricia Jenkins, Patricia Jenkins  Visit # : [id number]   MRN: [id number]  Physician: Alphia Mom  DOB/Age [birth year] (Age: 66) Gender: F  Collected Date: 09/27/2024  Received Date: 09/27/2024   FINAL DIAGNOSIS        1. Breast, right, needle core biopsy, 10:00 (coil clip) :       - INVASIVE DUCTAL CARCINOMA WITH MUCINOUS FEATURES       - TUBULE FORMATION: SCORE 3/3       - NUCLEAR PLEOMORPHISM: SCORE 2/3       -  MITOTIC COUNT: SCORE 2/3       - TOTAL SCORE: 7/9       - OVERALL GRADE: II/III       - LYMPHOVASCULAR INVASION: NOT IDENTIFIED       - CANCER LENGTH: 8 MM IN GREATEST LINEAR DIMENSION ON FRAGMENTED CORES.       - CALCIFICATIONS: NOT IDENTIFIED       - OTHER FINDINGS: N/A     The tumor cells are equivocal for Her2  (2+). Estrogen Receptor:  100%, positive, strong staining intensity Progesterone Receptor:  99%, positive, moderate to strong staining intensity Proliferation Marker Ki67: 10%   FLUORESCENCE IN-SITU HYBRIDIZATION   Results:  GROUP 1:  HER2 **POSITIVE**    IMPRESSION/PLAN:   Patient with an anatomic Stage I, prognostic Stage IA [id number] IDC of the R breast, Grade 2, ER/PR/HER2+ who is seen pre-operatively for consideration of adjuvant radiation therapy. We discussed the role of breast irradiation in reducing the risk of local recurrence and improving long-term disease control.  We reviewed the logistics of treatment in detail, including the need for a CT simulation for treatment planning, followed by several days for contouring, dosimetry, and quality assurance prior to starting therapy.   The planned course of radiation therapy will consist of daily treatments, Monday through Friday, over approximately 1-3 weeks. The decision between a moderately hypofractionated regimen and an ultrahypofractionated regimen will be made based on patient preference after discussion of the risks, benefits, and available data. It was explained that longer-term outcome data are available for moderate hypofractionation; however, based on current evidence, both approaches appear comparable in terms of disease control and toxicity profiles.  Each treatment session typically lasts only a few minutes, though positioning and setup may take additional time. The patient should plan to be in the department for approximately 45 minutes per visit. She will be evaluated by me at least once weekly during treatment to monitor for side effects, assess tolerance, and ensure it is safe to continue therapy.  We discussed acute and subacute side effects which are generally gradual in onset and typically include fatigue skin erythema, tanning or hyperpigmentation, breast edema or firmness, mild tenderness.  Less common but possible  side effects include desquamation of the skin, decreased range of motion of this shoulder, and transient changes in breast size texture.  Long-term risk such as cosmetic changes, rare risk of rib fracture, cardiopulmonary effects and very rare secondary malignancies were also reviewed.  Reassured patient that most acute side effects improve over weeks to months after completing therapy.    Patient verbalized understanding of these risks and benefits and she is in agreement with the plan. I will see her in follow-up after surgery. Final treatment planning will depend on her postoperative pathology report, but we anticipate adjuvant whole breast radiation. Do not anticipate the need to radiate her axilla or any nodal regions.   --  Total time spent today in preparation for this visit was *** minutes. This included patient care, imaging and path review, documentation, multidisciplinary discussion and coordination of care and follow up.    Estefana HERO. Maritza, M.D.

## 2024-10-06 ENCOUNTER — Inpatient Hospital Stay

## 2024-10-06 ENCOUNTER — Other Ambulatory Visit: Payer: Self-pay

## 2024-10-06 ENCOUNTER — Ambulatory Visit: Attending: General Surgery | Admitting: Physical Therapy

## 2024-10-06 ENCOUNTER — Ambulatory Visit
Admission: RE | Admit: 2024-10-06 | Discharge: 2024-10-06 | Disposition: A | Discharge status: Home / Self Care | Source: Ambulatory Visit | Attending: Radiation Oncology | Admitting: Radiation Oncology

## 2024-10-06 ENCOUNTER — Encounter: Payer: Self-pay | Admitting: *Deleted

## 2024-10-06 ENCOUNTER — Inpatient Hospital Stay: Attending: Hematology and Oncology | Admitting: Hematology and Oncology

## 2024-10-06 ENCOUNTER — Ambulatory Visit: Payer: Self-pay | Admitting: General Surgery

## 2024-10-06 ENCOUNTER — Encounter: Payer: Self-pay | Admitting: Physical Therapy

## 2024-10-06 ENCOUNTER — Inpatient Hospital Stay: Admitting: Licensed Clinical Social Worker

## 2024-10-06 VITALS — BP 126/74 | HR 94 | Temp 98.7°F | Resp 18 | Ht 69.0 in | Wt 233.3 lb

## 2024-10-06 DIAGNOSIS — C50411 Malignant neoplasm of upper-outer quadrant of right female breast: Secondary | ICD-10-CM | POA: Insufficient documentation

## 2024-10-06 DIAGNOSIS — Z170 Estrogen receptor positive status [ER+]: Secondary | ICD-10-CM | POA: Insufficient documentation

## 2024-10-06 DIAGNOSIS — R293 Abnormal posture: Secondary | ICD-10-CM | POA: Insufficient documentation

## 2024-10-06 DIAGNOSIS — Z1379 Encounter for other screening for genetic and chromosomal anomalies: Secondary | ICD-10-CM | POA: Diagnosis not present

## 2024-10-06 DIAGNOSIS — Z1731 Human epidermal growth factor receptor 2 positive status: Secondary | ICD-10-CM | POA: Insufficient documentation

## 2024-10-06 DIAGNOSIS — Z1721 Progesterone receptor positive status: Secondary | ICD-10-CM | POA: Insufficient documentation

## 2024-10-06 DIAGNOSIS — Z809 Family history of malignant neoplasm, unspecified: Secondary | ICD-10-CM | POA: Diagnosis not present

## 2024-10-06 LAB — CBC WITH DIFFERENTIAL (CANCER CENTER ONLY)
Abs Immature Granulocytes: 0.02 K/uL (ref 0.00–0.07)
Basophils Absolute: 0.1 K/uL (ref 0.0–0.1)
Basophils Relative: 1 %
Eosinophils Absolute: 0.1 K/uL (ref 0.0–0.5)
Eosinophils Relative: 1 %
HCT: 33.5 % — ABNORMAL LOW (ref 36.0–46.0)
Hemoglobin: 11.3 g/dL — ABNORMAL LOW (ref 12.0–15.0)
Immature Granulocytes: 0 %
Lymphocytes Relative: 22 %
Lymphs Abs: 2.1 K/uL (ref 0.7–4.0)
MCH: 26.3 pg (ref 26.0–34.0)
MCHC: 33.7 g/dL (ref 30.0–36.0)
MCV: 78.1 fL — ABNORMAL LOW (ref 80.0–100.0)
Monocytes Absolute: 0.8 K/uL (ref 0.1–1.0)
Monocytes Relative: 8 %
Neutro Abs: 6.6 K/uL (ref 1.7–7.7)
Neutrophils Relative %: 68 %
Platelet Count: 302 K/uL (ref 150–400)
RBC: 4.29 MIL/uL (ref 3.87–5.11)
RDW: 14.6 % (ref 11.5–15.5)
WBC Count: 9.6 K/uL (ref 4.0–10.5)
nRBC: 0 % (ref 0.0–0.2)

## 2024-10-06 LAB — CMP (CANCER CENTER ONLY)
ALT: 13 U/L (ref 0–44)
AST: 20 U/L (ref 15–41)
Albumin: 4.4 g/dL (ref 3.5–5.0)
Alkaline Phosphatase: 78 U/L (ref 38–126)
Anion gap: 11 (ref 5–15)
BUN: 6 mg/dL (ref 6–20)
CO2: 23 mmol/L (ref 22–32)
Calcium: 9.3 mg/dL (ref 8.9–10.3)
Chloride: 106 mmol/L (ref 98–111)
Creatinine: 0.63 mg/dL (ref 0.44–1.00)
GFR, Estimated: >60 mL/min (ref >60)
Glucose, Bld: 93 mg/dL (ref 70–99)
Potassium: 3.6 mmol/L (ref 3.5–5.1)
Sodium: 140 mmol/L (ref 135–145)
Total Bilirubin: 0.4 mg/dL (ref 0.0–1.2)
Total Protein: 7.8 g/dL (ref 6.5–8.1)

## 2024-10-06 NOTE — Progress Notes (Signed)
 [facility] Clinical Social Work  Initial Assessment   Patricia Jenkins is a 43 y.o. year old female accompanied virtually (by phone) by husband, Tee. Clinical Social Work was referred by [facility] for assessment of psychosocial needs.   SDOH (Social Determinants of Health) assessments performed: Yes SDOH Interventions    Flowsheet Row Clinical Support from 10/06/2024 in [facility] - A Dept Of [facility]  SDOH Interventions   Food Insecurity Interventions Intervention Not Indicated  Housing Interventions Intervention Not Indicated  Transportation Interventions Intervention Not Indicated  Utilities Interventions Intervention Not Indicated    SDOH Screenings   Food Insecurity: No Food Insecurity (10/06/2024)  Housing: Low Risk  (10/06/2024)  Transportation Needs: No Transportation Needs (10/06/2024)  Utilities: Not At Risk (10/06/2024)  Depression (PHQ2-9): Low Risk  (10/06/2024)  Tobacco Use: Medium Risk (10/06/2024)   Received from [facility]    PHQ 2/9:    10/06/2024   10:51 AM 01/09/2021    8:51 AM 08/08/2020    9:15 AM  Depression screen PHQ 2/9  Decreased Interest 0 0 0  Down, Depressed, Hopeless 0 0 0  PHQ - 2 Score 0 0 0  Altered sleeping  0   Tired, decreased energy  0   Change in appetite  0   Feeling bad or failure about yourself   0   Trouble concentrating  0   Moving slowly or fidgety/restless  0   Suicidal thoughts  0   PHQ-9 Score  0       Data saved with a previous flowsheet row definition     Distress Screen completed: No     No data to display            Family/Social Information:  Housing Arrangement: patient lives with her husband and 18yo son Toribio ([employer] senior). 22yo lives in [place] and marvetta is in college in [place] Family members/support persons in your life? 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  She feels work will be supportive.  Income source: Employment & husband's employment Financial concerns: No Type of concern: None Food access concerns: no Religious or spiritual practice: Yes-has strong faith and spirituality. Also attends church but has not told anyone there about diagnosis Advanced directives: No Services Currently in place:  [facility] & Medicaid  Coping/ Adjustment to diagnosis: Patient understands treatment plan and what happens next? Is learning about her plan today from her medical team. She expresses feeling confident in plan so far. Concerns about diagnosis and/or treatment: I'm not especially worried about anything Patient reported stressors: Adjusting to my illness Patient enjoys music Current coping skills/ strengths: Capable of independent living , Motivation for treatment/growth , Religious Affiliation , and Supportive family/friends     SUMMARY: Current SDOH Barriers:  No major barriers identified today  Clinical Social Work Clinical Goal(s):  No clinical social work goals at this time  Interventions: Discussed common feeling and emotions when being diagnosed with cancer, and the importance of support during treatment Informed patient of the support team roles and support services at [facility] Provided CSW contact information and encouraged patient to call with any questions or concerns   Follow Up Plan: Patient will contact CSW with any support or resource needs Patient verbalizes understanding of plan: Yes    Eran Windish E Bartosz Luginbill, LCSW Clinical Social Worker [facility]  Patient is participating in a  Managed Medicaid Plan:  Yes

## 2024-10-06 NOTE — Research (Signed)
 [employer] 2021-05 - Specimen Collection Study to Evaluate Biomarkers in Subjects with Cancer  Patient Patricia Jenkins was identified by Dr. Loretha as a potential candidate for the above listed study.  This Clinical Research Coordinator met with Patricia Jenkins, [id number], on 10/06/24 in a manner and location that ensures patient privacy to discuss participation in the above listed research study.  Patient is Unaccompanied.  A copy of the informed consent document with embedded HIPAA language was provided to the patient.  Patient reads, speaks, and understands English.   Patient was provided with the business card of this Coordinator and encouraged to contact the research team with any questions.  Approximately 15 minutes were spent with the patient reviewing the informed consent documents.  Patient was provided the option of taking informed consent documents home to review and was encouraged to review at their convenience with their support network, including other care providers. Patient took the consent documents home to review.   [name] agreed for the research team to follow up with her via phone call after 3:00 PM on any weekday.  Pt was thanked for her time and consideration regarding participation in the study. Pt knows to contact the research team at any time with any questions or concerns.   Abelardo Jock Clinical Research Coordinator [phone] 10/06/2024 11:57 AM

## 2024-10-06 NOTE — Progress Notes (Signed)
 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 Adjuvant chemotherapy recommended due to HER2 positivity and tumor size. APT trial regimen proposed.   - Perform lumpectomy of the right breast. - Place port for chemotherapy administration. - Administer APT trial chemotherapy regimen, weekly taxol and herceptin for 12 weeks. - Continue HER2-targeting antibody therapy for one year post-chemotherapy. - Monitor cardiac function during HER2-directed therapy. - Consider cold cap therapy to reduce hair loss. - Administer radiation therapy post-chemotherapy. - Consider tamoxifen therapy post-radiation for five years. - I will see her after surgery to discuss any additional recommendations.  HISTORY OF PRESENTING ILLNESS:  Patricia Jenkins 43 y.o. female is here because of new diagnosis of right sided breast cancer  Discussed the use of AI scribe software for clinical note transcription with the patient, who gave verbal consent to proceed.  History of Present Illness Patricia Jenkins is a 43 year old female with invasive ductal carcinoma of the right breast who presents for consultation regarding chemotherapy treatment options.  She initially detected a lump in her right breast, which led to further investigation. A mammogram revealed a suspicious area at the ten o'clock position, measuring  approximately 1.1 cm. Subsequent pathology confirmed the mass as invasive ductal carcinoma, specifically a triple positive breast cancer, indicating it is estrogen receptor (ER) positive, progesterone receptor (PR) positive, and HER2 positive.  She reports tenderness in the right breast, which has been present since before the biopsy. She has lost some weight and exercises regularly.  There is no significant family history of breast cancer. She began menstruating at age 12 and grew up in [place]. She works in presenter, broadcasting at [employer] and has not served in [employer]. She stopped eating meat around the age of 67 or 41 and is conscious of her diet, although she acknowledges a continued intake of sugars.  She had 3 kids. No prolonged use of birth control.  All other systems were reviewed with the patient and are negative.  MEDICAL HISTORY:  Past Medical History:  Diagnosis Date   Chronic headaches    Herpes    Pneumonia 2010    SURGICAL HISTORY: Past Surgical History:  Procedure Laterality Date   BREAST BIOPSY Right 09/27/2024   US  RT BREAST BX W LOC DEV 1ST LESION IMG BX SPEC US  GUIDE 09/27/2024 [facility] MAMMOGRAPHY   CHOLECYSTECTOMY  10/03/2011   Procedure: LAPAROSCOPIC CHOLECYSTECTOMY WITH INTRAOPERATIVE CHOLANGIOGRAM;  Surgeon: Vicenta DELENA Poli, MD;  Location: [facility] OR;  Service: General;  Laterality: N/A;   DILATION AND CURETTAGE OF UTERUS     TUBAL LIGATION      SOCIAL HISTORY: Social History   Socioeconomic History   Marital status: Married    Spouse name: Not on file   Number of children: Not on file   Years of education: Not on file   Highest education level: Not on file  Occupational History   Not on file  Tobacco Use   Smoking status: Never  Smokeless tobacco: Never  Substance and Sexual Activity   Alcohol use: Not Currently    Comment: occasionally   Drug use: No   Sexual activity: Yes    Partners: Male    Birth control/protection: Surgical     Comment: Tubal Ligation   Other Topics Concern   Not on file  Social History Narrative   ** Merged History Encounter **       Social Drivers of Health   Financial Resource Strain: Not on file  Food Insecurity: No Food Insecurity (10/06/2024)   Hunger Vital Sign    Worried About Running Out of Food in the Last Year: Never true    Ran Out of Food in the Last Year: Never true  Transportation Needs: No Transportation Needs (10/06/2024)   PRAPARE - Administrator, Civil Service (Medical): No    Lack of Transportation (Non-Medical): No  Physical Activity: Not on file  Stress: Not on file  Social Connections: Not on file  Intimate Partner Violence: Not At Risk (10/06/2024)   Humiliation, Afraid, Rape, and Kick questionnaire    Fear of Current or Ex-Partner: No    Emotionally Abused: No    Physically Abused: No    Sexually Abused: No    FAMILY HISTORY: Family History  Problem Relation Age of Onset   Hypertension Mother    Hypothyroidism Mother    Diabetes Father    Hypertension Father     ALLERGIES:  is allergic to ambien [zolpidem].  MEDICATIONS:  Current Outpatient Medications  Medication Sig Dispense Refill   brompheniramine-pseudoephedrine-DM 30-2-10 MG/5ML syrup Take 5 mLs by mouth 4 (four) times daily as needed. 240 mL 0   Flibanserin  (ADDYI ) 100 MG TABS Take 100 mg by mouth at bedtime. 30 tablet 10   meloxicam  (MOBIC ) 15 MG tablet Take 1 tablet (15 mg total) by mouth daily. 30 tablet 1   No current facility-administered medications for this visit.     PHYSICAL EXAMINATION: ECOG PERFORMANCE STATUS: 0 - Asymptomatic  Vitals:   10/06/24 0847  BP: 126/74  Pulse: 94  Resp: 18  Temp: 98.7 F (37.1 C)  SpO2: 100%   Filed Weights   10/06/24 0847  Weight: 233 lb 4.8 oz (105.8 kg)    GENERAL:alert, no distress and comfortable No palpable cervical or axillary adenopathy Palpable right breast mass at 10 clock measuring about a cm very close to the  nipple NEURO: no focal motor/sensory deficits CTA bilaterally No LE edema.  LABORATORY DATA:  I have reviewed the data as listed Lab Results  Component Value Date   WBC 7.1 01/09/2021   HGB 12.2 01/09/2021   HCT 36.7 01/09/2021   MCV 84 01/09/2021   PLT 319 01/09/2021     Chemistry      Component Value Date/Time   NA 141 01/09/2021 0931   K 4.3 01/09/2021 0931   CL 105 01/09/2021 0931   CO2 21 01/09/2021 0931   BUN 4 (L) 01/09/2021 0931   CREATININE 0.75 01/09/2021 0931   CREATININE 0.74 09/12/2016 1628      Component Value Date/Time   CALCIUM 9.4 01/09/2021 0931   ALKPHOS 71 01/09/2021 0931   AST 19 01/09/2021 0931   ALT 14 01/09/2021 0931   BILITOT 0.3 01/09/2021 0931       RADIOGRAPHIC STUDIES: I have personally reviewed the radiological images as listed and agreed with the findings in the report. US  RT BREAST BX W LOC DEV 1ST LESION IMG BX SPEC US   GUIDE Addendum Date: 09/28/2024 ADDENDUM REPORT: 09/28/2024 14:11 ADDENDUM: PATHOLOGY revealed: Breast, right, needle core biopsy, 10:00 (coil clip) - INVASIVE DUCTAL CARCINOMA WITH MUCINOUS FEATURES - OVERALL GRADE: II/III - LYMPHOVASCULAR INVASION: NOT IDENTIFIED - CANCER LENGTH: 8 MM IN GREATEST LINEAR DIMENSION ON FRAGMENTED CORES. - CALCIFICATIONS: NOT IDENTIFIED Pathology results are CONCORDANT with imaging findings, per Dr. Inocente Ast. Pathology results and recommendations were discussed with patient via telephone on 09/28/2024 by Rock Hover RN. Patient reported biopsy site doing well with no adverse symptoms, and slight tenderness at the site. Post biopsy care instructions were reviewed, questions were answered and my direct phone number was provided. Patient was instructed to call [facility] for any additional questions or concerns related to biopsy site. RECOMMENDATION: 1. Surgical and oncological consultation. Patient was referred to the [facility] at  [facility] with appointment on 10/06/2024. 2. Consider pretreatment bilateral breast MRI with and without contrast given patient's relatively young age and to evaluate extent of breast disease. Pathology results reported by Rock Hover RN on 09/28/2024. Electronically Signed   By: Inocente Ast M.D.   On: 09/28/2024 14:11   Result Date: 09/28/2024 CLINICAL DATA:  43 year old female presenting for biopsy of a mass in the right breast. EXAM: ULTRASOUND GUIDED RIGHT BREAST CORE NEEDLE BIOPSY COMPARISON:  Previous exam(s). PROCEDURE: I met with the patient and we discussed the procedure of ultrasound-guided biopsy, including benefits and alternatives. We discussed the high likelihood of a successful procedure. We discussed the risks of the procedure, including infection, bleeding, tissue injury, clip migration, and inadequate sampling. Informed written consent was given. The usual time-out protocol was performed immediately prior to the procedure. Lesion quadrant: Upper outer quadrant Using sterile technique and 1% Lidocaine as local anesthetic, under direct ultrasound visualization, a 14 gauge spring-loaded device was used to perform biopsy of a mass in the right breast at 10 o'clock using a lateral approach. At the conclusion of the procedure a coil shaped tissue marker clip was deployed into the biopsy cavity. Follow up 2 view mammogram was performed and dictated separately. IMPRESSION: Ultrasound guided biopsy of a mass in the right breast at 10 o'clock. No apparent complications. Electronically Signed: By: Inocente Ast M.D. On: 09/27/2024 15:26   MM CLIP PLACEMENT RIGHT Result Date: 09/27/2024 CLINICAL DATA:  Post procedure mammogram for clip placement EXAM: 3D DIAGNOSTIC RIGHT MAMMOGRAM POST ULTRASOUND BIOPSY COMPARISON:  Previous exam(s). ACR Breast Density Category c: The breasts are heterogeneously dense, which may obscure small masses. FINDINGS: 3D Mammographic images  were obtained following ultrasound guided biopsy of a mass in the right breast at 10 o'clock. The coil biopsy marking clip is in expected position at the site of biopsy. IMPRESSION: Appropriate positioning of the coil shaped biopsy marking clip at the site of biopsy in the right breast in the 10 o'clock position. Final Assessment: Post Procedure Mammograms for Marker Placement Electronically Signed   By: Inocente Ast M.D.   On: 09/27/2024 15:25   MM 3D DIAGNOSTIC MAMMOGRAM BILATERAL BREAST Result Date: 09/24/2024 CLINICAL DATA:  43 year old female here for evaluation of a right breast lump. EXAM: DIGITAL DIAGNOSTIC BILATERAL MAMMOGRAM WITH TOMOSYNTHESIS AND CAD; ULTRASOUND LEFT BREAST LIMITED; ULTRASOUND RIGHT BREAST LIMITED TECHNIQUE: Bilateral digital diagnostic mammography and breast tomosynthesis was performed. The images were evaluated with computer-aided detection. ; Targeted ultrasound examination of the left breast was performed.; Targeted ultrasound examination of the right breast was performed Additional 2 and 3D spot compression  views were obtained. COMPARISON:  Previous exam(s). ACR Breast Density Category c: The breasts are heterogeneously dense, which may obscure small masses. FINDINGS: Right mammogram: Spot compression views demonstrate a superficial oval equal density mass measuring up to 1 cm which underlies the palpable marker about the upper outer, anterior breast. No suspicious calcifications or architectural distortion. Left mammogram: Two adjacent equal density circumscribed masses about the slightly outer breast, middle third seen only on the CC view. These masses have the mammographic characteristics of a cyst. No findings suspicious for malignancy. Right breast ultrasound: Targeted imaging of the patient's palpable area of concern at about the 10 o'clock position, approximately 2 centimeters from the nipple, demonstrates a superficial, oval hypoechoic mass with partially indistinct  margins measuring approximately 1.1 x 1.1 x 0.6 cm. This mass has increased vascularity and requires biopsy for further characterization. Additional imaging of the right axilla was unremarkable. Left breast ultrasound: Targeted imaging at about the 12 o'clock position, approximately 8 centimeters from the nipple, demonstrates two adjacent simple cysts with the largest one measuring approximately 1.4 x 0.8 x 1.4 cm. This correlates favorably with the mammographic findings described above. Additional imaging of the left axilla was unremarkable. IMPRESSION: Indeterminate right breast mass measuring up to 1.1 cm. RECOMMENDATION: Right breast ultrasound-guided biopsy. I have discussed the findings and recommendations with the patient. If applicable, a reminder letter will be sent to the patient regarding the next appointment. BI-RADS CATEGORY  4: Suspicious. Electronically Signed   By: Curtistine Noble   On: 09/24/2024 09:05   US  LIMITED ULTRASOUND INCLUDING AXILLA LEFT BREAST  Result Date: 09/24/2024 CLINICAL DATA:  43 year old female here for evaluation of a right breast lump. EXAM: DIGITAL DIAGNOSTIC BILATERAL MAMMOGRAM WITH TOMOSYNTHESIS AND CAD; ULTRASOUND LEFT BREAST LIMITED; ULTRASOUND RIGHT BREAST LIMITED TECHNIQUE: Bilateral digital diagnostic mammography and breast tomosynthesis was performed. The images were evaluated with computer-aided detection. ; Targeted ultrasound examination of the left breast was performed.; Targeted ultrasound examination of the right breast was performed Additional 2 and 3D spot compression views were obtained. COMPARISON:  Previous exam(s). ACR Breast Density Category c: The breasts are heterogeneously dense, which may obscure small masses. FINDINGS: Right mammogram: Spot compression views demonstrate a superficial oval equal density mass measuring up to 1 cm which underlies the palpable marker about the upper outer, anterior breast. No suspicious calcifications or architectural  distortion. Left mammogram: Two adjacent equal density circumscribed masses about the slightly outer breast, middle third seen only on the CC view. These masses have the mammographic characteristics of a cyst. No findings suspicious for malignancy. Right breast ultrasound: Targeted imaging of the patient's palpable area of concern at about the 10 o'clock position, approximately 2 centimeters from the nipple, demonstrates a superficial, oval hypoechoic mass with partially indistinct margins measuring approximately 1.1 x 1.1 x 0.6 cm. This mass has increased vascularity and requires biopsy for further characterization. Additional imaging of the right axilla was unremarkable. Left breast ultrasound: Targeted imaging at about the 12 o'clock position, approximately 8 centimeters from the nipple, demonstrates two adjacent simple cysts with the largest one measuring approximately 1.4 x 0.8 x 1.4 cm. This correlates favorably with the mammographic findings described above. Additional imaging of the left axilla was unremarkable. IMPRESSION: Indeterminate right breast mass measuring up to 1.1 cm. RECOMMENDATION: Right breast ultrasound-guided biopsy. I have discussed the findings and recommendations with the patient. If applicable, a reminder letter will be sent to the patient regarding the next appointment. BI-RADS CATEGORY  4: Suspicious. Electronically Signed  By: Curtistine Noble   On: 09/24/2024 09:05   US  LIMITED ULTRASOUND INCLUDING AXILLA RIGHT BREAST Result Date: 09/24/2024 CLINICAL DATA:  43 year old female here for evaluation of a right breast lump. EXAM: DIGITAL DIAGNOSTIC BILATERAL MAMMOGRAM WITH TOMOSYNTHESIS AND CAD; ULTRASOUND LEFT BREAST LIMITED; ULTRASOUND RIGHT BREAST LIMITED TECHNIQUE: Bilateral digital diagnostic mammography and breast tomosynthesis was performed. The images were evaluated with computer-aided detection. ; Targeted ultrasound examination of the left breast was performed.; Targeted  ultrasound examination of the right breast was performed Additional 2 and 3D spot compression views were obtained. COMPARISON:  Previous exam(s). ACR Breast Density Category c: The breasts are heterogeneously dense, which may obscure small masses. FINDINGS: Right mammogram: Spot compression views demonstrate a superficial oval equal density mass measuring up to 1 cm which underlies the palpable marker about the upper outer, anterior breast. No suspicious calcifications or architectural distortion. Left mammogram: Two adjacent equal density circumscribed masses about the slightly outer breast, middle third seen only on the CC view. These masses have the mammographic characteristics of a cyst. No findings suspicious for malignancy. Right breast ultrasound: Targeted imaging of the patient's palpable area of concern at about the 10 o'clock position, approximately 2 centimeters from the nipple, demonstrates a superficial, oval hypoechoic mass with partially indistinct margins measuring approximately 1.1 x 1.1 x 0.6 cm. This mass has increased vascularity and requires biopsy for further characterization. Additional imaging of the right axilla was unremarkable. Left breast ultrasound: Targeted imaging at about the 12 o'clock position, approximately 8 centimeters from the nipple, demonstrates two adjacent simple cysts with the largest one measuring approximately 1.4 x 0.8 x 1.4 cm. This correlates favorably with the mammographic findings described above. Additional imaging of the left axilla was unremarkable. IMPRESSION: Indeterminate right breast mass measuring up to 1.1 cm. RECOMMENDATION: Right breast ultrasound-guided biopsy. I have discussed the findings and recommendations with the patient. If applicable, a reminder letter will be sent to the patient regarding the next appointment. BI-RADS CATEGORY  4: Suspicious. Electronically Signed   By: Curtistine Noble   On: 09/24/2024 09:05    All questions were answered.  The patient knows to call the clinic with any problems, questions or concerns. I spent 45 minutes in the care of this patient including H and P, review of records, counseling and coordination of care.     Amber Stalls, MD 10/06/2024 11:30 AM

## 2024-10-06 NOTE — Therapy (Signed)
 OUTPATIENT PHYSICAL THERAPY BREAST CANCER BASELINE EVALUATION   Patient Name: Patricia Jenkins MRN: [id number] DOB:11-08-1981, 43 y.o., female Today's Date: 10/06/2024  END OF SESSION:  PT End of Session - 10/06/24 1223     Visit Number 1    Number of Visits 2    Date for Recertification  12/01/24    PT Start Time 1001    PT Stop Time 1026    PT Time Calculation (min) 25 min    Activity Tolerance Patient tolerated treatment well    Behavior During Therapy [facility] for tasks assessed/performed          Past Medical History:  Diagnosis Date   Chronic headaches    Herpes    Pneumonia 2010   Past Surgical History:  Procedure Laterality Date   BREAST BIOPSY Right 09/27/2024   US  RT BREAST BX W LOC DEV 1ST LESION IMG BX SPEC US  GUIDE 09/27/2024 [facility]MO[facility]   CHOLECYSTECTOMY  10/03/2011   Procedure: LAPAROSCOPIC CHOLECYSTECTOMY WITH INTRAOPERATIVE CHOLANGIOGRAM;  Surgeon: Vicenta DELENA Poli, MD;  Location: [facility] OR;  Service: General;  Laterality: N/A;   DILATION AND CURETTAGE OF UTERUS     TUBAL LIGATION     Patient Active Problem List   Diagnosis Date Noted   Malignant neoplasm of upper-outer quadrant of right breast in female, estrogen receptor positive (HCC) 10/01/2024   Acute non-recurrent sinusitis 09/14/2016   Menorrhagia with regular cycle 09/14/2016   Right knee DJD 04/25/2015   Bialteral Quadriceps tendonitis 03/02/2015   Biliary colic 10/02/2011   Cholelithiasis 10/02/2011   CONSTIPATION 01/15/2007   CERVICAL DYSPLASIA 01/15/2007    REFERRING PROVIDER: Dr. Deward Null  REFERRING DIAG: Right breast cancer  THERAPY DIAG:  Malignant neoplasm of upper-outer quadrant of right breast in female, estrogen receptor positive (HCC)  Abnormal posture  Rationale for Evaluation and Treatment: Rehabilitation  ONSET DATE: 09/24/2024  SUBJECTIVE:                                                                                                                                                                                            SUBJECTIVE STATEMENT: Patient reports she is here today to be seen by her medical team for her newly diagnosed right breast cancer.   PERTINENT HISTORY:  Patient was diagnosed on 09/24/2024 with right grade 2 invasive ductal carcinoma breast cancer. It measures 1.1 cm and is located in the upper outer quadrant. It is triple positive with a Ki67 of 10%.   PATIENT GOALS:   reduce lymphedema risk and learn post op HEP.   PAIN:  Are you having pain? No  PRECAUTIONS: Active CA  RED FLAGS: None   HAND DOMINANCE: left  WEIGHT BEARING RESTRICTIONS: No  FALLS:  Has patient fallen in last 6 months? No  LIVING ENVIRONMENT: Patient lives with: husband, 71 y.o. son, 27 y.o. step-daughter, 39 and 55 y.o. grandchildren Lives in: House/apartment Has following equipment at home: None  OCCUPATION: Works in HR at [employer]: Not currently exercising  PRIOR LEVEL OF FUNCTION: Independent   OBJECTIVE: Note: Objective measures were completed at Evaluation unless otherwise noted.  COGNITION: Overall cognitive status: Within functional limits for tasks assessed    POSTURE:  Forward head and rounded shoulders posture  UPPER EXTREMITY AROM/PROM:  A/PROM RIGHT   eval   Shoulder extension 49  Shoulder flexion 138  Shoulder abduction 150  Shoulder internal rotation 60  Shoulder external rotation 90    (Blank rows = not tested)  A/PROM LEFT   eval  Shoulder extension 50  Shoulder flexion 145  Shoulder abduction 160  Shoulder internal rotation 65  Shoulder external rotation 90    (Blank rows = not tested)  CERVICAL AROM: All within normal limits  UPPER EXTREMITY STRENGTH: WNL  LYMPHEDEMA ASSESSMENTS (in cm):   LANDMARK RIGHT   eval  10 cm proximal to olecranon process from proximal aspect of olecranon 36.6  Olecranon process 29.3  10 cm proximal to ulnar styloid process from proximal aspect of  styloid process 24  Just distal to ulnar styloid process 16.3  Across hand at thumb web space 19.5  At base of 2nd digit 6.6  (Blank rows = not tested)  LANDMARK LEFT   eval  10 cm proximal to olecranon process from proximal aspect of olecranon 35.6   Olecranon process 28.9  10 cm proximal to ulnar styloid process from proximal aspect of styloid process 23  Just distal to ulnar styloid process 16.5  Across hand at thumb web space 19.5  At base of 2nd digit 6.4  (Blank rows = not tested)  L-DEX LYMPHEDEMA SCREENING:  The patient was assessed using the L-Dex machine today to produce a lymphedema index baseline score. The patient will be reassessed on a regular basis (typically every 3 months) to obtain new L-Dex scores. If the score is > 6.5 points away from his/her baseline score indicating onset of subclinical lymphedema, it will be recommended to wear a compression garment for 4 weeks, 12 hours per day and then be reassessed. If the score continues to be > 6.5 points from baseline at reassessment, we will initiate lymphedema treatment. Assessing in this manner has a 95% rate of preventing clinically significant lymphedema.   L-DEX FLOWSHEETS - 10/06/24 1200       L-DEX LYMPHEDEMA SCREENING   Measurement Type Unilateral    L-DEX MEASUREMENT EXTREMITY Upper Extremity    POSITION  Standing    DOMINANT SIDE Left    At Risk Side Right    BASELINE SCORE (UNILATERAL) 2.9          QUICK DASH SURVEY:  Junie Palin - 10/06/24 0001     Open a tight or new jar No difficulty    Do heavy household chores (wash walls, wash floors) No difficulty    Carry a shopping bag or briefcase No difficulty    Wash your back No difficulty    Use a knife to cut food No difficulty    Recreational activities in which you take some force or impact through your arm, shoulder, or hand (golf, hammering, tennis) No difficulty    During the past  week, to what extent has your arm, shoulder or hand problem  interfered with your normal social activities with family, friends, neighbors, or groups? Not at all    During the past week, to what extent has your arm, shoulder or hand problem limited your work or other regular daily activities Not at all    Arm, shoulder, or hand pain. None    Tingling (pins and needles) in your arm, shoulder, or hand None    Difficulty Sleeping No difficulty    DASH Score 0 %           PATIENT EDUCATION:  Education details: Time spent educating patient on aspects of self-care to maximize post op recovery. Patient was educated on where and how to get a post op compression bra to use to reduce post op edema. Patient was also educated on the use of SOZO screenings and surveillance principles for early identification of lymphedema onset. She was instructed to use the post op pillow in the axilla for pressure and pain relief. Patient educated on lymphedema risk reduction and post op shoulder/posture HEP. Person educated: Patient Education method: Explanation, Demonstration, Handout Education comprehension: Patient verbalized understanding and returned demonstration  HOME EXERCISE PROGRAM: Patient was instructed today in a home exercise program today for post op shoulder range of motion. These included active assist shoulder flexion in sitting, scapular retraction, wall walking with shoulder abduction, and hands behind head external rotation.  She was encouraged to do these twice a day, holding 3 seconds and repeating 5 times when permitted by her physician.   ASSESSMENT:  CLINICAL IMPRESSION: Patient was diagnosed on 09/24/2024 with right grade 2 invasive ductal carcinoma breast cancer. It measures 1.1 cm and is located in the upper outer quadrant. It is triple positive with a Ki67 of 10%. Her multidisciplinary medical team met prior to her assessments to determine a recommended treatment plan. She is planning to have a right lumpectomy and sentinel node biopsy followed by  chemotherapy, radiation, and anti-estrogen therapy. She will benefit from a post op PT reassessment to determine needs and from L-Dex screens every 3 months for 2 years to detect subclinical lymphedema.  Pt will benefit from skilled therapeutic intervention to improve on the following deficits: Decreased knowledge of precautions, impaired UE functional use, pain, decreased ROM, postural dysfunction.   PT treatment/interventions: ADL/self-care home management, pt/family education, therapeutic exercise  REHAB POTENTIAL: Excellent  CLINICAL DECISION MAKING: Stable/uncomplicated  EVALUATION COMPLEXITY: Low   GOALS: Goals reviewed with patient? YES  LONG TERM GOALS: (STG=LTG)    Name Target Date Goal status  1 Pt will be able to verbalize understanding of pertinent lymphedema risk reduction practices relevant to her dx specifically related to skin care.  Baseline:  No knowledge 10/06/2024 Achieved at eval  2 Pt will be able to return demo and/or verbalize understanding of the post op HEP related to regaining shoulder ROM. Baseline:  No knowledge 10/06/2024 Achieved at eval  3 Pt will be able to verbalize understanding of the importance of viewing the post op After Breast CA Class video for further lymphedema risk reduction education and therapeutic exercise.  Baseline:  No knowledge 10/06/2024 Achieved at eval  4 Pt will demo she has regained full shoulder ROM and function post operatively compared to baselines.  Baseline: See objective measurements taken today. 12/01/2024     PLAN:  PT FREQUENCY/DURATION: EVAL and 1 follow up appointment.   PLAN FOR NEXT SESSION: will reassess 3-4 weeks post op to determine needs.  Patient will follow up at outpatient cancer rehab 3-4 weeks following surgery.  If the patient requires physical therapy at that time, a specific plan will be dictated and sent to the referring physician for approval. The patient was educated today on appropriate basic  range of motion exercises to begin post operatively and the importance of viewing the After Breast Cancer class video following surgery.  Patient was educated today on lymphedema risk reduction practices as it pertains to recommendations that will benefit the patient immediately following surgery.  She verbalized good understanding.    Physical Therapy Information for After Breast Cancer Surgery/Treatment:  Lymphedema is a swelling condition that you may be at risk for in your arm if you have lymph nodes removed from the armpit area.  After a sentinel node biopsy, the risk is approximately 5-9% and is higher after an axillary node dissection.  There is treatment available for this condition and it is not life-threatening.  Contact your physician or physical therapist with concerns. You may begin the 4 shoulder/posture exercises (see additional sheet) when permitted by your physician (typically a week after surgery).  If you have drains, you may need to wait until those are removed before beginning range of motion exercises.  A general recommendation is to not lift your arms above shoulder height until drains are removed.  These exercises should be done to your tolerance and gently.  This is not a no pain/no gain type of recovery so listen to your body and stretch into the range of motion that you can tolerate, stopping if you have pain.  If you are having immediate reconstruction, ask your plastic surgeon about doing exercises as he or she may want you to wait. We encourage you to view the After Breast Cancer class video following surgery.  You will learn information related to lymphedema risk, prevention and treatment and additional exercises to regain mobility following surgery.   While undergoing any medical procedure or treatment, try to avoid blood pressure being taken or needle sticks from occurring on the arm on the side of cancer.   This recommendation begins after surgery and continues for the rest  of your life.  This may help reduce your risk of getting lymphedema (swelling in your arm). An excellent resource for those seeking information on lymphedema is the National Lymphedema Network's web site. It can be accessed at www.lymphnet.org If you notice swelling in your hand, arm or breast at any time following surgery (even if it is many years from now), please contact your doctor or physical therapist to discuss this.  Lymphedema can be treated at any time but it is easier for you if it is treated early on.  If you feel like your shoulder motion is not returning to normal in a reasonable amount of time, please contact your surgeon or physical therapist.  [facility] [phone]. [address], [address], [place] [address]  [facility] After Breast Cancer Class  After Breast Cancer Class is a specially designed exercise class video to assist you in a safe recover after having breast cancer surgery.  In this video you will learn how to get back to full function whether your drains were just removed or if you had surgery a month ago. The video can be viewed on this page: [email or web address]-meyer.org/ or on YouTube here: [email or web address].  Class Goals  Understand specific stretches to improve the flexibility of you chest and shoulder. Learn ways to safely strengthen your upper  body and improve your posture. Understand the warning signs of infection and why you may be at risk for an arm infection. Learn about Lymphedema and prevention.  ** You do not need to view this video until after surgery.  Drains should be removed to participate in the recommended exercises on the video.  Patient was instructed today in a home exercise program today for post op shoulder range of motion. These included active assist shoulder flexion in sitting, scapular retraction, wall walking with shoulder abduction,  and hands behind head external rotation.  She was encouraged to do these twice a day, holding 3 seconds and repeating 5 times when permitted by her physician.   Eward Wonda Sharps, [place] 10/06/24 12:41 PM

## 2024-10-08 DIAGNOSIS — Z809 Family history of malignant neoplasm, unspecified: Secondary | ICD-10-CM | POA: Insufficient documentation

## 2024-10-08 NOTE — Progress Notes (Signed)
 REFERRING PROVIDER: Curvin Deward MOULD, MD [address] [place],  [place] [address]  PRIMARY PROVIDER:  Pcp, No  PRIMARY REASON FOR VISIT:  1. Malignant neoplasm of upper-outer quadrant of right breast in female, estrogen receptor positive (HCC)   2. Family history of cancer     HISTORY OF PRESENT ILLNESS:   Patricia Jenkins, a 43 y.o. female, was seen on 10/06/2024 for a [facility] cancer genetics consultation in the [facility] at the request of Dr. Curvin due to a personal history of breast cancer.  Patricia Jenkins presents to clinic today to discuss the possibility of a hereditary predisposition to cancer, genetic testing, and to further clarify her future cancer risks, as well as potential cancer risks for family members.   CANCER HISTORY:   In 2025, at the age of 45, Patricia Jenkins was diagnosed with invasive ductal carcinoma of the right breast, triple positive. The treatment plan is a lumpectomy, chemotherapy, and radiation. Please see Dr. Walter note for a detailed plan.  RELEVANT MEDICAL HISTORY AND RISK FACTORS:  Menarche was at age 77.  First live birth at age 15.  OCP use for approximately 2 years.  Ovaries intact: yes.  Hysterectomy: no.  Menopausal status: premenopausal.  HRT use: 0 years. Mammogram within the last year: yes. Breast density: C Number of breast biopsies: 1. Up to date with pelvic exams: yes. Colonoscopy: no; not examined. Any excessive radiation exposure in the past: no Other cancer screenings: no.  Exposures: no.   Past Medical History:  Diagnosis Date   Chronic headaches    Family history of cancer    Herpes    Pneumonia 11/18/2008    Past Surgical History:  Procedure Laterality Date   BREAST BIOPSY Right 09/27/2024   US  RT BREAST BX W LOC DEV 1ST LESION IMG BX SPEC US  GUIDE 09/27/2024 [facility] MAMMOGRAPHY   CHOLECYSTECTOMY  10/03/2011   Procedure: LAPAROSCOPIC CHOLECYSTECTOMY WITH INTRAOPERATIVE CHOLANGIOGRAM;   Surgeon: Vicenta DELENA Poli, MD;  Location: [facility] OR;  Service: General;  Laterality: N/A;   DILATION AND CURETTAGE OF UTERUS     TUBAL LIGATION      Social History   Socioeconomic History   Marital status: Married    Spouse name: Not on file   Number of children: Not on file   Years of education: Not on file   Highest education level: Not on file  Occupational History   Not on file  Tobacco Use   Smoking status: Never   Smokeless tobacco: Never  Substance and Sexual Activity   Alcohol use: Not Currently    Comment: occasionally   Drug use: No   Sexual activity: Yes    Partners: Male    Birth control/protection: Surgical    Comment: Tubal Ligation   Other Topics Concern   Not on file  Social History Narrative   ** Merged History Encounter **       Social Drivers of Health   Financial Resource Strain: Not on file  Food Insecurity: No Food Insecurity (10/06/2024)   Hunger Vital Sign    Worried About Running Out of Food in the Last Year: Never true    Ran Out of Food in the Last Year: Never true  Transportation Needs: No Transportation Needs (10/06/2024)   PRAPARE - Administrator, Civil Service (Medical): No    Lack of Transportation (Non-Medical): No  Physical Activity: Not on file  Stress: Not on file  Social Connections: Not  on file     FAMILY HISTORY:  We obtained a detailed, 4-generation family history.  Significant diagnoses are listed below: Family History  Problem Relation Age of Onset   Hypertension Mother    Hypothyroidism Mother    Diabetes Father    Hypertension Father     Patricia Jenkins reported the following family history: She had two maternal aunts with cancer, but unknown types.  She does not report any other family history of cancer  Patricia Jenkins is unaware of previous family history of genetic testing for hereditary cancer risks.  There is no reported Ashkenazi Jewish ancestry. There is no known consanguinity.  GENETIC COUNSELING  ASSESSMENT:  Patricia Jenkins is a 43 y.o. female with a personal history of breast cancer under the age of 67 which is somewhat suggestive of a hereditary cancer sydnrome and predisposition to cancer given this history. We, therefore, discussed and recommended the following at today's visit.   DISCUSSION: We discussed that, in general, most cancer is not inherited in families, but instead is sporadic or familial. Sporadic cancers occur by chance and typically happen at older ages (>50 years) as this type of cancer is caused by genetic changes acquired during an individual's lifetime. Some families have more cancers than would be expected by chance; however, the ages or types of cancer are not consistent with a known genetic mutation or known genetic mutations have been ruled out. This type of familial cancer is thought to be due to a combination of multiple genetic, environmental, hormonal, and lifestyle factors. While this combination of factors likely increases the risk of cancer, the exact source of this risk is not currently identifiable or testable.  We discussed that 5 - 10% of breast cancer is hereditary. Most cases of hereditary breast cancer are associated with BRCA1 and BRCA2 genes, although there are other genes associated with hereditary breast cancer as well. Cancer risks and management strategies are gene specific. We discussed that genetic testing can beneficial for several reasons, including clarifying specific cancer risks, identifying potential screening and risk-reduction options that may be appropriate, and to understand if other family members could be at risk for cancer and allow them to undergo genetic testing to clarify their cancer risks.   We reviewed the characteristics, features and inheritance patterns of hereditary cancer syndromes. We also discussed genetic testing, including the appropriate family members to test, the process of testing, insurance coverage and turn-around-time for  results. We discussed the implications of a negative, positive, carrier and/or variant of uncertain significant result.   Patricia Jenkins was offered the [employer] + RNAinsight gene panel which includes sequencing, rearrangement analysis, and RNA analysis for the following 40 genes: APC, ATM, BAP1, BARD1, BMPR1A, BRCA1, BRCA2, BRIP1, CDH1, CDKN2A, CHEK2, FH, FLCN, MET, MLH1, MSH2, MSH6, MUTYH, NF1, NTHL1, PALB2, PMS2, PTEN, RAD51C, RAD51D, RPS20, SMAD4, STK11, TP53, TSC1, TSC2, and VHL (sequencing and deletion/duplication); AXIN2, HOXB13, MBD4, MSH3, POLD1 and POLE (sequencing only); EPCAM and GREM1 (deletion/duplication only).   Patricia Jenkins was also offered the [employer] CancerNext-Expanded + RNAinsight gene panel which includes sequencing, rearrangement, and RNA analysis for the following 77 genes: AIP, ALK, APC, ATM, AXIN2, BAP1, BARD1, BMPR1A, BRCA1, BRCA2, BRIP1, CDC73, CDH1, CDK4, CDKN1B, CDKN2A, CEBPA, CHEK2, CTNNA1, DDX41, DICER1, ETV6, FH, FLCN, GATA2, LZTR1, MAX, MBD4, MEN1, MET, MLH1, MSH2, MSH3, MSH6, MUTYH, NF1, NF2, NTHL1, PALB2, PHOX2B, PMS2, POT1, PRKAR1A, PTCH1, PTEN, RAD51C, RAD51D, RB1, RET, RPS20, RUNX1, SDHA, SDHAF2, SDHB, SDHC, SDHD, SMAD4, SMARCA4, SMARCB1, SMARCE1, STK11, SUFU, TMEM127,  TP53, TSC1, TSC2, VHL, and WT1 (sequencing and deletion/duplication); EGFR, HOXB13, KIT, MITF, PDGFRA, POLD1, and POLE (sequencing only); EPCAM and GREM1 (deletion/duplication only).    Patricia Jenkins was informed of the benefits and limitations of each panel, including that expanded pan-cancer panels contain genes may not have clear management guidelines at this point in time. We also discussed that as the number of genes included on a panel increases, the chances of variants of uncertain significance increases. If Patricia Jenkins desires genetic testing we would run the [employer] which is a 13 gene breast cancer gene panel that would be run stat and result in 7-10 days to be used for surgical  decision-making, and this would run concurrently with either the CancerNext or CancerNext-Expanded panel depending on which panel was chosen by Patricia Jenkins.  Based on Patricia Jenkins's personal history of cancer, she meets medical criteria for genetic testing. she meets criteria due to being diagnosed with breast cancer under the age of 55. Despite that she meets criteria, she may still have an out of pocket cost. We discussed that if her out of pocket cost for testing is over $100, the laboratory will call and confirm whether she wants to proceed with testing.  If the out of pocket cost of testing is less than $100 she will be billed by the genetic testing laboratory.   After considering the risks, benefits, and limitations, Patricia Jenkins did NOT provide informed consent to pursue genetic testing. She wants to think more about the testing and will let us  know if she wants to move forward with testing. If she wants to do genetic testing in the future, the Genetic Information Nondiscrimination Act should be discussed with Patricia Jenkins.   PLAN: Despite our recommendation, Patricia Jenkins did not wish to pursue genetic testing at today's visit. We understand this decision and remain available to coordinate genetic testing at any time in the future. We, therefore, recommend Patricia Jenkins continue to follow the cancer screening guidelines given by her healthcare providers.  RESOURCES PROVIDED:  Patricia Jenkins was provided with the following:  [facility] Contact card   Lastly, we encouraged Patricia Jenkins to remain in contact with cancer genetics annually so that we can continuously update the family history and inform her of any changes in cancer genetics and testing that may be of benefit for this family.   Patricia Jenkins's questions were answered to her satisfaction today. Our contact information was provided should additional questions or concerns arise. Thank you for the referral and allowing us  to  share in the care of your patient.   Sada Mazzoni R. Bluford, MS, [facility] Certified [email or web address] phone: [phone]  I personally spent a total of 20 minutes in the care of the patient today including preparing to see the patient, getting/reviewing separately obtained history, counseling and educating, referring and communicating with other health care professionals, and documenting clinical information in the EHR.  The patient was seen alone. Drs. Lanny Stalls, and/or Gudena were available for questions, if needed.   _______________________________________________________________________ For Office Staff:  Number of people involved in session: 1 Was an Intern/ student involved with case: no

## 2024-10-12 ENCOUNTER — Encounter: Payer: Self-pay | Admitting: *Deleted

## 2024-10-12 ENCOUNTER — Telehealth: Payer: Self-pay | Admitting: *Deleted

## 2024-10-12 ENCOUNTER — Telehealth: Payer: Self-pay

## 2024-10-12 DIAGNOSIS — C50411 Malignant neoplasm of upper-outer quadrant of right female breast: Secondary | ICD-10-CM

## 2024-10-12 NOTE — Telephone Encounter (Signed)
 [employer] 2021-05 - Specimen Collection Study to Evaluate Biomarkers in Subjects with Cancer    Ms. Huyett  was contacted by phone at 3:20 PM on 10/12/2024 regarding the participation of the above study. Pt. declined the study. Pt was thanked for her time. Pt knows to contact research team any time with research-related questions/concerns.  Abelardo Jock Clinical Research Coordinator [phone] 10/12/2024 3:29 PM

## 2024-10-12 NOTE — Telephone Encounter (Signed)
 I spoke with her today.  She is refusing to schedule anything at this point. She told me she is still processing everything and is not ready to move forward.  Offered Alight Guide also but patient refused. I asked her I could follow up with her in a week and she was ok with that.  I asked her about 2nd opinion but she declined that as well.  I will call her next week and see if she has made any decisions. She has my contact information and I encouraged her to reach out if she has questions.

## 2024-10-18 ENCOUNTER — Ambulatory Visit (HOSPITAL_COMMUNITY)
Admission: RE | Admit: 2024-10-18 | Discharge: 2024-10-18 | Disposition: A | Source: Ambulatory Visit | Attending: Hematology and Oncology

## 2024-10-18 DIAGNOSIS — Z79899 Other long term (current) drug therapy: Secondary | ICD-10-CM

## 2024-10-18 DIAGNOSIS — Z170 Estrogen receptor positive status [ER+]: Secondary | ICD-10-CM | POA: Diagnosis not present

## 2024-10-18 DIAGNOSIS — C50411 Malignant neoplasm of upper-outer quadrant of right female breast: Secondary | ICD-10-CM | POA: Insufficient documentation

## 2024-10-18 DIAGNOSIS — Z01818 Encounter for other preprocedural examination: Secondary | ICD-10-CM | POA: Diagnosis present

## 2024-10-18 LAB — ECHOCARDIOGRAM COMPLETE
AR max vel: 2.41 cm2
AV Area VTI: 2.2 cm2
AV Area mean vel: 2.8 cm2
AV Mean grad: 4 mmHg
AV Peak grad: 7.1 mmHg
Ao pk vel: 1.33 m/s
Area-P 1/2: 3.79 cm2
Calc EF: 50.1 %
S' Lateral: 3.3 cm
Single Plane A2C EF: 49.6 %
Single Plane A4C EF: 52.5 %

## 2024-10-18 NOTE — Progress Notes (Signed)
  Echocardiogram 2D Echocardiogram has been performed.  Charmaine KANDICE Gaskins 10/18/2024, 12:09 PM

## 2024-10-20 ENCOUNTER — Encounter: Payer: Self-pay | Admitting: *Deleted

## 2024-10-20 ENCOUNTER — Telehealth: Payer: Self-pay | Admitting: *Deleted

## 2024-10-20 NOTE — Telephone Encounter (Signed)
 Spoke with patient to follow up from our last conversation regarding treatment. She states she still is not ready and does not want proceed with any form of treatment at this point.  I offered appts back to see med onc, surgeon and she declined.  She states she is still in shock.  She did agree that I could follow up with her in a couple weeks.  She has my contact information if she needs anything before then.

## 2024-10-22 ENCOUNTER — Emergency Department (HOSPITAL_BASED_OUTPATIENT_CLINIC_OR_DEPARTMENT_OTHER)
Admission: EM | Admit: 2024-10-22 | Discharge: 2024-10-22 | Disposition: A | Discharge status: Home / Self Care | Source: Ambulatory Visit | Attending: Emergency Medicine | Admitting: Emergency Medicine

## 2024-10-22 ENCOUNTER — Other Ambulatory Visit: Payer: Self-pay

## 2024-10-22 ENCOUNTER — Emergency Department (HOSPITAL_BASED_OUTPATIENT_CLINIC_OR_DEPARTMENT_OTHER)

## 2024-10-22 ENCOUNTER — Telehealth: Payer: Self-pay | Admitting: *Deleted

## 2024-10-22 ENCOUNTER — Encounter (HOSPITAL_BASED_OUTPATIENT_CLINIC_OR_DEPARTMENT_OTHER): Payer: Self-pay | Admitting: Emergency Medicine

## 2024-10-22 DIAGNOSIS — R072 Precordial pain: Secondary | ICD-10-CM | POA: Insufficient documentation

## 2024-10-22 DIAGNOSIS — Z853 Personal history of malignant neoplasm of breast: Secondary | ICD-10-CM | POA: Insufficient documentation

## 2024-10-22 DIAGNOSIS — R0602 Shortness of breath: Secondary | ICD-10-CM | POA: Insufficient documentation

## 2024-10-22 HISTORY — DX: Malignant neoplasm of unspecified site of unspecified female breast: C50.919

## 2024-10-22 LAB — BASIC METABOLIC PANEL WITH GFR
Anion gap: 11 (ref 5–15)
BUN: <5 mg/dL — ABNORMAL LOW (ref 6–20)
CO2: 25 mmol/L (ref 22–32)
Calcium: 9.4 mg/dL (ref 8.9–10.3)
Chloride: 103 mmol/L (ref 98–111)
Creatinine, Ser: 0.63 mg/dL (ref 0.44–1.00)
GFR, Estimated: >60 mL/min (ref >60)
Glucose, Bld: 97 mg/dL (ref 70–99)
Potassium: 3.5 mmol/L (ref 3.5–5.1)
Sodium: 140 mmol/L (ref 135–145)

## 2024-10-22 LAB — RESP PANEL BY RT-PCR (RSV, FLU A&B, COVID)  RVPGX2
Influenza A by PCR: NEGATIVE
Influenza B by PCR: NEGATIVE
Resp Syncytial Virus by PCR: NEGATIVE
SARS Coronavirus 2 by RT PCR: NEGATIVE

## 2024-10-22 LAB — CBC
HCT: 33.8 % — ABNORMAL LOW (ref 36.0–46.0)
Hemoglobin: 11.3 g/dL — ABNORMAL LOW (ref 12.0–15.0)
MCH: 26.4 pg (ref 26.0–34.0)
MCHC: 33.4 g/dL (ref 30.0–36.0)
MCV: 79 fL — ABNORMAL LOW (ref 80.0–100.0)
Platelets: 319 K/uL (ref 150–400)
RBC: 4.28 MIL/uL (ref 3.87–5.11)
RDW: 14.3 % (ref 11.5–15.5)
WBC: 6.7 K/uL (ref 4.0–10.5)
nRBC: 0 % (ref 0.0–0.2)

## 2024-10-22 LAB — TROPONIN T, HIGH SENSITIVITY
Troponin T High Sensitivity: <15 ng/L (ref 0–19)
Troponin T High Sensitivity: <15 ng/L (ref 0–19)

## 2024-10-22 LAB — HCG, SERUM, QUALITATIVE: Preg, Serum: NEGATIVE

## 2024-10-22 MED ORDER — IOHEXOL 350 MG/ML SOLN
75.0000 mL | Freq: Once | INTRAVENOUS | Status: AC | PRN
  Administered 2024-10-22: 75 mL via INTRAVENOUS

## 2024-10-22 NOTE — Discharge Instructions (Signed)
 It was a pleasure taking care of you here today  We have placed a referral to cardiology  Make sure to follow-up outpatient, return for new or worsening symptoms

## 2024-10-22 NOTE — ED Provider Notes (Signed)
 [place] EMERGENCY DEPARTMENT AT [facility] Provider Note   CSN: [id number] Arrival date & time: 10/22/24  1544    Patient presents with: Chest Pain   Patricia Jenkins is a 43 y.o. female here for evaluation of chest pain.  Ongoing over the last month or so.  Constant in nature.  Does not radiate.  Associated shortness of breath.  She has a recent diagnosis of breast cancer not currently undergoing treatment.  She has a chronic nonproductive cough.  No hemoptysis.  No history of PE or DVT.  Feels like she cannot take a deep breath at times.  Does have some shortness of breath with going up or down stairs which has been ongoing for quite some time.  No vomiting, diaphoresis.  No fever, recent illnesses, IVDU.  She occasionally gets swelling in her feet however no pain/ swelling to her calves.  No recent surgery or immobilization.  Not followed by cardiology however was recently seen for an echocardiogram a few days ago.  Had an echocardiogram 10/18/2024 with EF 50-55%   HPI     Prior to Admission medications   Medication Sig Start Date End Date Taking? Authorizing Provider  brompheniramine-pseudoephedrine-DM 30-2-10 MG/5ML syrup Take 5 mLs by mouth 4 (four) times daily as needed. 12/11/21   Fleming, Zelda W, NP  Flibanserin  (ADDYI ) 100 MG TABS Take 100 mg by mouth at bedtime. 10/23/21   Anyanwu, Ugonna A, MD  meloxicam  (MOBIC ) 15 MG tablet Take 1 tablet (15 mg total) by mouth daily. 11/30/21   Janit Thresa HERO, DPM    Allergies: Ambien [zolpidem]    Review of Systems  Constitutional: Negative.   HENT: Negative.    Respiratory:  Positive for cough and shortness of breath.   Cardiovascular:  Positive for chest pain. Negative for palpitations and leg swelling.  Gastrointestinal: Negative.   Genitourinary: Negative.   Musculoskeletal: Negative.   Skin: Negative.   Neurological: Negative.   All other systems reviewed and are negative.   Updated Vital Signs BP 122/82  (BP Location: Right Arm)   Pulse 80   Temp 97.9 F (36.6 C) (Oral)   Resp 18   LMP 09/16/2024 (Approximate)   SpO2 100%   Physical Exam Vitals and nursing note reviewed.  Constitutional:      General: She is not in acute distress.    Appearance: She is well-developed. She is not ill-appearing, toxic-appearing or diaphoretic.  HENT:     Head: Atraumatic.  Eyes:     Pupils: Pupils are equal, round, and reactive to light.  Cardiovascular:     Rate and Rhythm: Normal rate.     Pulses:          Radial pulses are 2+ on the right side and 2+ on the left side.       Posterior tibial pulses are 2+ on the right side and 2+ on the left side.     Heart sounds: Normal heart sounds.  Pulmonary:     Effort: Pulmonary effort is normal. No respiratory distress.     Breath sounds: Normal breath sounds.     Comments: Clear bilaterally, speaks in full sentences without difficulty Chest:     Comments: Mild anterior chest wall tenderness without crepitus or step-off Abdominal:     General: Bowel sounds are normal. There is no distension.     Palpations: Abdomen is soft.     Tenderness: There is no abdominal tenderness.  Musculoskeletal:  General: Normal range of motion.     Cervical back: Normal range of motion.     Right lower leg: No tenderness. No edema.     Left lower leg: No tenderness. No edema.     Comments: No bony tenderness, compartments soft, full range of motion  Skin:    General: Skin is warm and dry.  Neurological:     General: No focal deficit present.     Mental Status: She is alert.  Psychiatric:        Mood and Affect: Mood normal.     (all labs ordered are listed, but only abnormal results are displayed) Labs Reviewed  BASIC METABOLIC PANEL WITH GFR - Abnormal; Notable for the following components:      Result Value   BUN <5 (*)    All other components within normal limits  CBC - Abnormal; Notable for the following components:   Hemoglobin 11.3 (*)    HCT  33.8 (*)    MCV 79.0 (*)    All other components within normal limits  RESP PANEL BY RT-PCR (RSV, FLU A&B, COVID)  RVPGX2  HCG, SERUM, QUALITATIVE  TROPONIN T, HIGH SENSITIVITY  TROPONIN T, HIGH SENSITIVITY    EKG: None  Radiology: CT Angio Chest PE W and/or Wo Contrast Result Date: 10/22/2024 EXAM: CTA of the Chest with contrast for PE 10/22/2024 07:23:49 PM TECHNIQUE: CTA of the chest was performed after the administration of 75 mL of iohexol  (OMNIPAQUE ) 350 MG/ML injection. Multiplanar reformatted images are provided for review. MIP images are provided for review. Automated exposure control, iterative reconstruction, and/or weight based adjustment of the mA/kV was utilized to reduce the radiation dose to as low as reasonably achievable. COMPARISON: None available. CLINICAL HISTORY: Pulmonary embolism (PE) suspected, high prob; cp, ca. FINDINGS: PULMONARY ARTERIES: Lumen evaluation at the subsegmental level is limited due to timing of contrast and motion artifact. No pulmonary embolism. Main pulmonary artery is normal in caliber. MEDIASTINUM: The heart and pericardium demonstrate no acute abnormality. There is no acute abnormality of the thoracic aorta. LYMPH NODES: No mediastinal, hilar or axillary lymphadenopathy. LUNGS AND PLEURA: The lungs are without acute process. No focal consolidation or pulmonary edema. No pleural effusion or pneumothorax. UPPER ABDOMEN: Good density lesions within the kidneys likely represent simple renal cysts. Simple renal cysts do not require additional follow-up unless clinically indicated due to signs/symptoms. Status post cholecystectomy. SOFT TISSUES AND BONES: No acute bone or soft tissue abnormality. IMPRESSION: 1. No pulmonary embolism identified, with evaluation limited at the subsegmental level due to timing of contrast and motion artifact.+ 2. Other, non-acute and/or normal findings as above. Electronically signed by: Morgane Naveau MD 10/22/2024 07:29 PM EST  RP Workstation: [id number]     Procedures   Medications Ordered in the ED  iohexol  (OMNIPAQUE ) 350 MG/ML injection 75 mL (75 mLs Intravenous Contrast Given 10/22/24 1922)   43 yo here for evaluation of chest pain.  Has been ongoing for over a month.  Associated shortness of breath as well as cough.  She had a recent diagnosis of breast cancer not currently undergoing treatment.  She denies any prior history of PE or DVT.  Here she is afebrile, nonseptic, non-ill-appearing.  Does not appear grossly fluid overloaded.  Her calves are soft, nontender.  Her heart and lungs are clear.  Her abdomen soft, tender.  Will plan on labs, imaging, reassess  Labs and imaging personally viewed and interpreted:  CBC without leukocytosis Metabolic panel without significant abnormality  Pregnancy test negative Troponin negative x 2 COVID, flu, RSV negative CTA negative for large PE EKG without ischemic changes  Patient reassessed.  Discussed labs and imaging.  Will have her follow-up with cardiology given her pain over the last month.  At this time I have low suspicion for acute ACS, unstable angina, PE, dissection, pneumothorax, infiltrates, fluid overload, myocarditis, pericarditis, endocarditis, fracture, perforated viscus, tamponade.  She will return for new or worsening symptoms.  The patient has been appropriately medically screened and/or stabilized in the ED. I have low suspicion for any other emergent medical condition which would require further screening, evaluation or treatment in the ED or require inpatient management.  Patient is hemodynamically stable and in no acute distress.  Patient able to ambulate in department prior to ED.  Evaluation does not show acute pathology that would require ongoing or additional emergent interventions while in the emergency department or further inpatient treatment.  I have discussed the diagnosis with the patient and answered all questions.  Pain is been managed  while in the emergency department and patient has no further complaints prior to discharge.  Patient is comfortable with plan discussed in room and is stable for discharge at this time.  I have discussed strict return precautions for returning to the emergency department.  Patient was encouraged to follow-up with PCP/specialist refer to at discharge.                                   Medical Decision Making Amount and/or Complexity of Data Reviewed External Data Reviewed: labs, radiology, ECG and notes. Labs: ordered. Decision-making details documented in ED Course. Radiology: ordered and independent interpretation performed. Decision-making details documented in ED Course. ECG/medicine tests: ordered and independent interpretation performed. Decision-making details documented in ED Course.  Risk OTC drugs. Prescription drug management. Decision regarding hospitalization. Diagnosis or treatment significantly limited by social determinants of health.        Final diagnoses:  Precordial pain    ED Discharge Orders          Ordered    Ambulatory referral to Cardiology       Comments: If you have not heard from the Cardiology office within the next 72 hours please call [phone].   10/22/24 2155               Prisca Gearing A, PA-C 10/22/24 2217    Tonia Chew, MD 10/23/24 [phone]

## 2024-10-22 NOTE — Telephone Encounter (Signed)
 Received call from patient stating she was experiencing constant chest pain and shortness of breath. She states the pain radiates up her neck and shoulder.  She feels out of breath and pain in her chest when taking a deep breath.  I instructed her to go to the emergency department (Dr. Loretha agrees) at [facility] or [place] as that may be closer for her to be evaluated.  Patient states she would prefer to go to [facility]. Informed her when she arrived to let them know her symptoms and that would be able to see my note as well. Patient verbalized understanding.

## 2024-10-22 NOTE — ED Triage Notes (Signed)
 Centralized Chest pain, sob x weeks Its gotten worse

## 2024-11-02 ENCOUNTER — Encounter: Payer: Self-pay | Admitting: *Deleted

## 2024-11-02 ENCOUNTER — Telehealth: Payer: Self-pay | Admitting: *Deleted

## 2024-11-02 NOTE — Telephone Encounter (Signed)
 Spoke with patient again regarding treatment decisions.  She states she is still in the same place and does not want to proceed with anything at this time. Relayed concerns regarding delaying treatment. Again offered appts with providers to discuss further.  Patient states she understands but is not willing to commit to anything at this time and declined follow up.  Encouraged her to call me with any questions or concerns

## 2024-11-12 ENCOUNTER — Other Ambulatory Visit: Payer: Self-pay

## 2024-11-12 ENCOUNTER — Other Ambulatory Visit (HOSPITAL_COMMUNITY)
Admission: RE | Admit: 2024-11-12 | Discharge: 2024-11-12 | Disposition: A | Discharge status: Home / Self Care | Source: Ambulatory Visit | Attending: Obstetrics and Gynecology | Admitting: Obstetrics and Gynecology

## 2024-11-12 ENCOUNTER — Ambulatory Visit (INDEPENDENT_AMBULATORY_CARE_PROVIDER_SITE_OTHER): Admitting: Obstetrics and Gynecology

## 2024-11-12 ENCOUNTER — Encounter: Payer: Self-pay | Admitting: Obstetrics and Gynecology

## 2024-11-12 VITALS — BP 125/84 | HR 84 | Wt 227.0 lb

## 2024-11-12 DIAGNOSIS — Z124 Encounter for screening for malignant neoplasm of cervix: Secondary | ICD-10-CM

## 2024-11-12 DIAGNOSIS — Z01419 Encounter for gynecological examination (general) (routine) without abnormal findings: Secondary | ICD-10-CM

## 2024-11-12 NOTE — Progress Notes (Signed)
 "  ANNUAL EXAM Patient name: Patricia Jenkins MRN [id number]  Date of birth: [birth year] Chief Complaint:   Breast Concerns  History of Present Illness:   Patricia Jenkins is a 43 y.o. [phone] being seen today for a routine annual exam.  Current complaints: none  Menstrual concerns? No   Breast or nipple changes? Yes recent diagnosis of breast cancer Contraception use? Yes s/p TL Sexually active? 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 Has EAP at work with 8 free sessions and will look into using this.   Patient's last menstrual period was 11/08/2024 (exact date).   The pregnancy intention screening data noted above was reviewed. Potential methods of contraception were discussed. The patient elected to proceed with No data recorded.   Last pap     Component Value Date/Time   DIAGPAP  10/23/2021 0824    - Negative for intraepithelial lesion or malignancy (NILM)   DIAGPAP  12/03/2017 0000    NEGATIVE FOR INTRAEPITHELIAL LESIONS OR MALIGNANCY.   HPVHIGH Negative 10/23/2021 0824   ADEQPAP  10/23/2021 0824    Satisfactory for evaluation; transformation zone component PRESENT.   ADEQPAP  12/03/2017 0000    Satisfactory for evaluation  endocervical/transformation zone component PRESENT.   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  Last colonoscopy: n/a.      11/12/2024   10:30 AM 10/06/2024   10:51 AM 01/09/2021    8:51 AM 08/08/2020    9:15 AM 12/03/2017   11:58 AM  Depression screen PHQ 2/9  Decreased Interest 0 0 0 0 0  Down, Depressed, Hopeless 0 0 0 0 0  PHQ - 2 Score 0 0 0 0 0  Altered sleeping 0  0    Tired, decreased energy 0  0    Change in appetite 0  0    Feeling bad or failure about yourself  0  0    Trouble concentrating 0  0    Moving slowly or fidgety/restless 0  0    Suicidal  thoughts 0  0    PHQ-9 Score 0  0        Data saved with a previous flowsheet row definition        11/12/2024   10:31 AM 01/09/2021    8:51 AM 08/08/2020    9:15 AM 12/03/2017   11:58 AM  GAD 7 : Generalized Anxiety Score  Nervous, Anxious, on Edge 0 0 0 0  Control/stop worrying 0 0 0 0  Worry too much - different things 0 0 0 0  Trouble relaxing 0 0 0 0  Restless 0 0 0 0  Easily annoyed or irritable 0 0 0 0  Afraid - awful might happen 0 0 0 0  Total GAD 7 Score 0 0 0 0     Review of Systems:   Pertinent items are noted in HPI Denies any headaches, blurred vision, fatigue, shortness of breath, chest pain, abdominal pain, abnormal vaginal discharge/itching/odor/irritation, problems with periods, bowel movements, urination, or intercourse unless otherwise stated above. Pertinent History Reviewed:  Reviewed past medical,surgical, social and family history.  Reviewed problem list, medications and allergies. Physical Assessment:   Vitals:   11/12/24 1023  BP: 125/84  Pulse: 84  Weight: 227 lb (103 kg)  Body mass index is 33.52 kg/m.  Physical Examination:   General appearance - well appearing, and in no distress  Mental status - alert, oriented to person, place, and time  Psych:  She has a normal mood and affect  Skin - warm and dry, normal color, no suspicious lesions noted  Chest - effort normal  Abdomen - soft, nontender, nondistended, no masses or organomegaly  Pelvic -  VULVA: normal appearing vulva with no masses, tenderness or lesions   VAGINA: normal appearing vagina with normal color and discharge, no lesions   CERVIX: normal appearing cervix without discharge or lesions, no CMT  Thin prep pap is done with HR HPV cotesting  UTERUS: uterus is felt to be normal size, shape, consistency and nontender   ADNEXA: No adnexal masses or tenderness noted.  Extremities:  No swelling or varicosities noted  Chaperone present for exam  No results found for this or  any previous visit (from the past 24 hours).    Assessment & Plan:  1. Well woman exam with routine gynecological exam (Primary) - Cervical cancer screening: Discussed guidelines. Pap with HPV collected - STD Testing: declines - Birth Control: Discussed options and their risks, benefits and common side effects; discussed VTE with estrogen containing options. Desires: hx of TL - Breast Health: has been seen by genetic counselor, med onc, rad onc and surgery. Offered follow up with [facility], will use work EAP first and continue processing w/ friend and husband as well as prayer/faith.  - F/U 12 months and prn   2. Screening for cervical cancer Pap completed today  - Cytology - PAP   No orders of the defined types were placed in this encounter.   Meds: No orders of the defined types were placed in this encounter.   Follow-up: No follow-ups on file.  Carter Quarry, MD 11/12/2024 11:00 AM "

## 2024-11-17 ENCOUNTER — Ambulatory Visit: Payer: Self-pay | Admitting: Obstetrics and Gynecology

## 2024-11-17 LAB — CYTOLOGY - PAP
Adequacy: ABSENT
Comment: NEGATIVE
Diagnosis: NEGATIVE
Diagnosis: REACTIVE
High risk HPV: NEGATIVE

## 2024-12-03 NOTE — Progress Notes (Signed)
 " Cardiology Office Note:  .   Date:  12/06/2024  ID:  Patricia Jenkins, DOB 12-21-1980, MRN [id number] PCP: Pcp, No   HeartCare Providers Cardiologist:  None   History of Present Illness: .    Chief Complaint  Patient presents with   Chest Pain    Patricia Jenkins is a 44 y.o. female with below history who presents for the evaluation of chest pain at the request of Henderly, Britni A, PA-C. Seen ER 10/2024 for CP with negative work-up.   History of Present Illness   Patricia Jenkins is a 44 year old female with obesity who presents with chest pain. She was referred from the ER for evaluation of chest pain.  She experiences chest pain characterized by a heaviness in the chest and difficulty breathing. The symptoms began in early December, prompting an ER visit on December 5th. At that time, an EKG was performed and was normal, and a workup for myocardial infarction was negative. She describes the pain as 'heavy' and notes that it occurs sporadically, not consistently as it did in December.  The chest pain is sometimes accompanied by shortness of breath and palpitations, particularly during light activities such as doing laundry or walking around the house. The symptoms last for a few minutes and are relieved by sitting down and calming herself. She denies any specific triggers for the episodes and mentions that they occur even without significant physical exertion.  She has a history of obesity with a BMI of 34. No history of high blood pressure, diabetes, heart attack, or stroke. She is not currently taking any medications.  She reports experiencing fevers and chills, which she attributes to her recent diagnosis of breast cancer. She is currently not undergoing treatment for the cancer. She reports feeling highly stressed.  No significant family history of heart disease, although her son was born with a heart condition. She does not smoke or consume alcohol. She  has three grown children.       Problem List Breast CA -ER+ PR+ HER2+  ROS: All other ROS reviewed and negative. Pertinent positives noted in the HPI.     Studies Reviewed: SABRA       EKG: NSR 84 bpm, no acute changes 10/22/2024  TTE 10/18/2024  1. Left ventricular ejection fraction, by estimation, is 50 to 55%. The  left ventricle has low normal function. The left ventricle has no regional  wall motion abnormalities. Left ventricular diastolic parameters were  normal. The average left ventricular   global longitudinal strain is -14.7 %. The global longitudinal strain is  abnormal.   2. Right ventricular systolic function is normal. The right ventricular  size is normal.   3. The mitral valve is normal in structure. Trivial mitral valve  regurgitation. No evidence of mitral stenosis.   4. The aortic valve is tricuspid. Aortic valve regurgitation is not  visualized. No aortic stenosis is present.   Physical Exam:   VS:  BP 130/78   Pulse (!) 104   Ht 5' 9 (1.753 m)   Wt 233 lb 12.8 oz (106.1 kg)   LMP 11/08/2024 (Exact Date)   SpO2 99%   BMI 34.53 kg/m    Wt Readings from Last 3 Encounters:  12/06/24 233 lb 12.8 oz (106.1 kg)  11/12/24 227 lb (103 kg)  10/06/24 233 lb 4.8 oz (105.8 kg)    GEN: Well nourished, well developed in no acute distress NECK: No JVD; No carotid bruits CARDIAC:  RRR, no murmurs, rubs, gallops RESPIRATORY:  Clear to auscultation without rales, wheezing or rhonchi  ABDOMEN: Soft, non-tender, non-distended EXTREMITIES:  No edema; No deformity  ASSESSMENT AND PLAN: .   Assessment and Plan    Evaluation of chest pain, shortness of breath, and palpitations Intermittent symptoms since December. Normal EKG and heart ultrasound. Differential includes stress-related symptoms versus cardiac etiology. Stress likely due to young age and lack of cardiac risk factors, but further evaluation needed due to potential cancer treatment. - Ordered two-week [employer]  sensitive skin heart monitor to evaluate for arrhythmias or extra heartbeats. - I offered her a CCTA to exclude obstructive CAD, but she would like to hold on this.    Breast CA - She is undecided if she will pursue treatment. I have encouraged follow-up with oncology.                Follow-up: Return if symptoms worsen or fail to improve.  Signed, Darryle DASEN. Barbaraann, MD, [facility]  [facility]  [address] [place], [place] [address] [phone]  5:09 PM   "

## 2024-12-06 ENCOUNTER — Ambulatory Visit (INDEPENDENT_AMBULATORY_CARE_PROVIDER_SITE_OTHER): Admitting: Cardiovascular Disease

## 2024-12-06 ENCOUNTER — Encounter (HOSPITAL_BASED_OUTPATIENT_CLINIC_OR_DEPARTMENT_OTHER): Payer: Self-pay | Admitting: Cardiovascular Disease

## 2024-12-06 VITALS — BP 130/78 | HR 104 | Ht 69.0 in | Wt 233.8 lb

## 2024-12-06 DIAGNOSIS — R072 Precordial pain: Secondary | ICD-10-CM | POA: Diagnosis not present

## 2024-12-06 DIAGNOSIS — R0602 Shortness of breath: Secondary | ICD-10-CM | POA: Diagnosis not present

## 2024-12-06 DIAGNOSIS — R002 Palpitations: Secondary | ICD-10-CM | POA: Diagnosis not present

## 2024-12-06 NOTE — Patient Instructions (Addendum)
 Medication Instructions:  No changes *If you need a refill on your cardiac medications before your next appointment, please call your pharmacy*  Lab Work: none If you have labs (blood work) drawn today and your tests are completely normal, you will receive your results only by: MyChart Message (if you have MyChart) OR A paper copy in the mail If you have any lab test that is abnormal or we need to change your treatment, we will call you to review the results.  Testing/Procedures: 2 week monitor - we will work to ensure you get the sensitive skin patches  The [employer] monitor has hypoallergenic patches.  This is to be worn for 14 days.   Follow-Up: As needed  Other Instructions       [employer] Cardiac Event Monitor Instructions  Your physician has requested you wear your cardiac event monitor for 14 days. [employer] may call or text to confirm a shipping address. The monitor will be sent to a land address via UPS. [employer] will not ship a monitor to a PO BOX. It typically takes 3-5 days to receive your monitor after it has been enrolled. [employer] will assist with USPS tracking if your package is delayed. The telephone number for [employer] is [phone]. Once you have received your monitor, please review the enclosed instructions. Instruction tutorials can also be viewed under help and settings on the enclosed cell phone. Your monitor has already been registered assigning a specific monitor serial # to you.  Billing and Self Pay Discount Information  [employer] has been provided the insurance information we had on file for you.  If your insurance has been updated, please call [employer] at [phone] to provide them with your updated insurance information.   [employer] offers a discounted Self Pay option for patients who have insurance that does not cover their cardiac event monitor or patients without insurance.  The discounted cost of a Self Pay Cardiac Event  Monitor would be $225.00 , if the patient contacts [employer] at [phone] within 7 days of applying the monitor to make payment arrangements.  If the patient does not contact [employer] within 7 days of applying the monitor, the cost of the cardiac event monitor will be $350.00.  Applying the monitor  Remove cell phone from case and turn it on. The cell phone works as it consultant and needs to be within [facility] of you at all times. The cell phone will need to be charged on a daily basis. We recommend you plug the cell phone into the enclosed charger at your bedside table every night.  Monitor batteries: You will receive two monitor batteries labelled #1 and #2. These are your recorders. Plug battery #2 onto the second connection on the enclosed charger. Keep one battery on the charger at all times. This will keep the monitor battery deactivated. It will also keep it fully charged for when you need to switch your monitor batteries. A small light will be blinking on the battery emblem when it is charging. The light on the battery emblem will remain on when the battery is fully charged.  Open package of a Monitor strip. Insert battery #1 into black hood on strip and gently squeeze monitor battery onto connection as indicated in instruction booklet. Set aside while preparing skin.  Choose location for your strip, vertical or horizontal, as indicated in the instruction booklet. Shave to remove all hair from location. There cannot be any lotions, oils, powders, or colognes on skin where monitor is to be  applied. Wipe skin clean with enclosed Saline wipe. Dry skin completely.  Peel paper labeled #1 off the back of the Monitor strip exposing the adhesive. Place the monitor on the chest in the vertical or horizontal position shown in the instruction booklet. One arrow on the monitor strip must be pointing upward. Carefully remove paper labeled #2, attaching remainder of strip to your skin. Try  not to create any folds or wrinkles in the strip as you apply it.  Firmly press and release the circle in the center of the monitor battery. You will hear a small beep. This is turning the monitor battery on. The heart emblem on the monitor battery will light up every 5 seconds if the monitor battery in turned on and connected to the patient securely. Do not push and hold the circle down as this turns the monitor battery off. The cell phone will locate the monitor battery. A screen will appear on the cell phone checking the connection of your monitor strip. This may read poor connection initially but change to good connection within the next minute. Once your monitor accepts the connection you will hear a series of 3 beeps followed by a climbing crescendo of beeps. A screen will appear on the cell phone showing the two monitor strip placement options. Touch the picture that demonstrates where you applied the monitor strip.  Your monitor strip and battery are waterproof. You are able to shower, bathe, or swim with the monitor on. They just ask you do not submerge deeper than 3 feet underwater. We recommend removing the monitor if you are swimming in a lake, river, or ocean.  Your monitor battery will need to be switched to a fully charged monitor battery approximately once a week. The cell phone will alert you of an action which needs to be made.  On the cell phone, tap for details to reveal connection status, monitor battery status, and cell phone battery status. The green dots indicates your monitor is in good status. A red dot indicates there is something that needs your attention.  To record a symptom, click the circle on the monitor battery. In 30-60 seconds a list of symptoms will appear on the cell phone. Select your symptom and tap save. Your monitor will record a sustained or significant arrhythmia regardless of you clicking the button. Some patients do not feel the heart rhythm  irregularities. [employer] will notify us  of any serious or critical events.  Refer to instruction booklet for instructions on switching batteries, changing strips, the Do not disturb or Pause features, or any additional questions.  Call [employer] at [phone], to confirm your monitor is transmitting and record your baseline. They will answer any questions you may have regarding the monitor instructions at that time.  Returning the monitor to [employer]  Place all equipment back into blue box. Peel off strip of paper to expose adhesive and close box securely. There is a prepaid UPS shipping label on this box. Drop in a UPS drop box, or at a UPS facility like [employer]. You may also contact [employer] to arrange UPS to pick up monitor package at your home.

## 2024-12-07 ENCOUNTER — Encounter: Payer: Self-pay | Admitting: *Deleted

## 2024-12-07 NOTE — Progress Notes (Signed)
 Patient enrolled for [employer]/ [employer] to ship a 14 day cardiac event monitor with sensitive skin/ Hydrocolloid strips. Dr. Barbaraann to read.

## 2024-12-10 ENCOUNTER — Telehealth: Payer: Self-pay | Admitting: *Deleted

## 2024-12-10 ENCOUNTER — Encounter: Payer: Self-pay | Admitting: *Deleted

## 2024-12-10 NOTE — Telephone Encounter (Signed)
 Spoke with patient to follow up to see if she has changed her mind regarding treatment.  She states she has not and does not want to pursue any kind of treatment at this point and doesn't feel like she will change her mind.  Offered appts to discuss again - pt. Declined. She has my contact information and I encouraged her to call me if she decides to pursue treatment.  Patient appreciative for the call and verbalized understanding.
